# Patient Record
Sex: Female | Born: 1955 | Race: White | Hispanic: No | Smoking: Never smoker
Health system: Southern US, Community
[De-identification: ages and names within clinical notes are randomized; demographics above are authoritative.]

## PROBLEM LIST (undated history)

## (undated) DIAGNOSIS — J302 Other seasonal allergic rhinitis: Secondary | ICD-10-CM

## (undated) DIAGNOSIS — T8859XA Other complications of anesthesia, initial encounter: Secondary | ICD-10-CM

## (undated) DIAGNOSIS — J45909 Unspecified asthma, uncomplicated: Secondary | ICD-10-CM

## (undated) DIAGNOSIS — E739 Lactose intolerance, unspecified: Secondary | ICD-10-CM

## (undated) DIAGNOSIS — I2510 Atherosclerotic heart disease of native coronary artery without angina pectoris: Secondary | ICD-10-CM

## (undated) DIAGNOSIS — R42 Dizziness and giddiness: Secondary | ICD-10-CM

## (undated) DIAGNOSIS — T4145XA Adverse effect of unspecified anesthetic, initial encounter: Secondary | ICD-10-CM

## (undated) DIAGNOSIS — M1990 Unspecified osteoarthritis, unspecified site: Secondary | ICD-10-CM

## (undated) DIAGNOSIS — M2550 Pain in unspecified joint: Secondary | ICD-10-CM

## (undated) DIAGNOSIS — M549 Dorsalgia, unspecified: Secondary | ICD-10-CM

## (undated) DIAGNOSIS — R112 Nausea with vomiting, unspecified: Secondary | ICD-10-CM

## (undated) DIAGNOSIS — Z9889 Other specified postprocedural states: Secondary | ICD-10-CM

## (undated) DIAGNOSIS — Z9109 Other allergy status, other than to drugs and biological substances: Secondary | ICD-10-CM

## (undated) DIAGNOSIS — E559 Vitamin D deficiency, unspecified: Secondary | ICD-10-CM

## (undated) HISTORY — DX: Dorsalgia, unspecified: M54.9

## (undated) HISTORY — DX: Atherosclerotic heart disease of native coronary artery without angina pectoris: I25.10

## (undated) HISTORY — DX: Unspecified osteoarthritis, unspecified site: M19.90

## (undated) HISTORY — DX: Vitamin D deficiency, unspecified: E55.9

## (undated) HISTORY — PX: ABDOMINAL HYSTERECTOMY: SHX81

## (undated) HISTORY — PX: CERVICAL CERCLAGE: SHX1329

## (undated) HISTORY — DX: Pain in unspecified joint: M25.50

## (undated) HISTORY — PX: COLONOSCOPY: SHX174

## (undated) HISTORY — DX: Lactose intolerance, unspecified: E73.9

## (undated) HISTORY — DX: Other seasonal allergic rhinitis: J30.2

## (undated) HISTORY — PX: KNEE ARTHROSCOPY: SUR90

---

## 1998-03-09 ENCOUNTER — Other Ambulatory Visit: Admission: RE | Admit: 1998-03-09 | Discharge: 1998-03-09 | Payer: Self-pay | Admitting: Obstetrics and Gynecology

## 1999-04-18 ENCOUNTER — Other Ambulatory Visit: Admission: RE | Admit: 1999-04-18 | Discharge: 1999-04-18 | Payer: Self-pay | Admitting: Obstetrics and Gynecology

## 2000-05-27 ENCOUNTER — Other Ambulatory Visit: Admission: RE | Admit: 2000-05-27 | Discharge: 2000-05-27 | Payer: Self-pay | Admitting: Obstetrics and Gynecology

## 2000-07-13 ENCOUNTER — Other Ambulatory Visit: Admission: RE | Admit: 2000-07-13 | Discharge: 2000-07-13 | Payer: Self-pay | Admitting: Obstetrics and Gynecology

## 2000-12-21 ENCOUNTER — Other Ambulatory Visit: Admission: RE | Admit: 2000-12-21 | Discharge: 2000-12-21 | Payer: Self-pay | Admitting: Obstetrics and Gynecology

## 2001-07-20 ENCOUNTER — Other Ambulatory Visit: Admission: RE | Admit: 2001-07-20 | Discharge: 2001-07-20 | Payer: Self-pay | Admitting: Obstetrics and Gynecology

## 2001-10-19 ENCOUNTER — Observation Stay (HOSPITAL_COMMUNITY): Admission: RE | Admit: 2001-10-19 | Discharge: 2001-10-20 | Payer: Self-pay | Admitting: Obstetrics and Gynecology

## 2002-08-01 ENCOUNTER — Other Ambulatory Visit: Admission: RE | Admit: 2002-08-01 | Discharge: 2002-08-01 | Payer: Self-pay | Admitting: Obstetrics and Gynecology

## 2003-01-17 ENCOUNTER — Other Ambulatory Visit: Admission: RE | Admit: 2003-01-17 | Discharge: 2003-01-17 | Payer: Self-pay | Admitting: Obstetrics and Gynecology

## 2003-07-20 ENCOUNTER — Other Ambulatory Visit: Admission: RE | Admit: 2003-07-20 | Discharge: 2003-07-20 | Payer: Self-pay | Admitting: Obstetrics and Gynecology

## 2003-08-18 ENCOUNTER — Encounter: Admission: RE | Admit: 2003-08-18 | Discharge: 2003-08-18 | Payer: Self-pay | Admitting: Obstetrics and Gynecology

## 2004-08-06 ENCOUNTER — Ambulatory Visit (HOSPITAL_BASED_OUTPATIENT_CLINIC_OR_DEPARTMENT_OTHER): Admission: RE | Admit: 2004-08-06 | Discharge: 2004-08-06 | Payer: Self-pay | Admitting: Orthopedic Surgery

## 2004-09-16 ENCOUNTER — Encounter: Admission: RE | Admit: 2004-09-16 | Discharge: 2004-09-16 | Payer: Self-pay | Admitting: Obstetrics and Gynecology

## 2004-10-02 ENCOUNTER — Other Ambulatory Visit: Admission: RE | Admit: 2004-10-02 | Discharge: 2004-10-02 | Payer: Self-pay | Admitting: Obstetrics and Gynecology

## 2005-09-18 ENCOUNTER — Encounter: Admission: RE | Admit: 2005-09-18 | Discharge: 2005-09-18 | Payer: Self-pay | Admitting: Obstetrics and Gynecology

## 2005-10-29 ENCOUNTER — Other Ambulatory Visit: Admission: RE | Admit: 2005-10-29 | Discharge: 2005-10-29 | Payer: Self-pay | Admitting: Obstetrics and Gynecology

## 2011-06-17 ENCOUNTER — Other Ambulatory Visit: Payer: Self-pay | Admitting: Obstetrics and Gynecology

## 2011-06-17 DIAGNOSIS — R928 Other abnormal and inconclusive findings on diagnostic imaging of breast: Secondary | ICD-10-CM

## 2011-07-01 ENCOUNTER — Ambulatory Visit
Admission: RE | Admit: 2011-07-01 | Discharge: 2011-07-01 | Disposition: A | Discharge status: Home / Self Care | Payer: BC Managed Care – PPO | Source: Ambulatory Visit | Attending: Obstetrics and Gynecology | Admitting: Obstetrics and Gynecology

## 2011-07-01 ENCOUNTER — Other Ambulatory Visit: Payer: Self-pay | Admitting: Obstetrics and Gynecology

## 2011-07-01 DIAGNOSIS — R928 Other abnormal and inconclusive findings on diagnostic imaging of breast: Secondary | ICD-10-CM

## 2011-11-25 ENCOUNTER — Other Ambulatory Visit: Payer: Self-pay | Admitting: Obstetrics and Gynecology

## 2011-11-25 DIAGNOSIS — N6019 Diffuse cystic mastopathy of unspecified breast: Secondary | ICD-10-CM

## 2011-12-18 ENCOUNTER — Ambulatory Visit
Admission: RE | Admit: 2011-12-18 | Discharge: 2011-12-18 | Disposition: A | Discharge status: Home / Self Care | Payer: BC Managed Care – PPO | Source: Ambulatory Visit | Attending: Obstetrics and Gynecology | Admitting: Obstetrics and Gynecology

## 2011-12-18 DIAGNOSIS — N6019 Diffuse cystic mastopathy of unspecified breast: Secondary | ICD-10-CM

## 2012-05-10 ENCOUNTER — Other Ambulatory Visit: Payer: Self-pay | Admitting: Obstetrics and Gynecology

## 2012-05-10 DIAGNOSIS — R921 Mammographic calcification found on diagnostic imaging of breast: Secondary | ICD-10-CM

## 2012-06-15 ENCOUNTER — Ambulatory Visit
Admission: RE | Admit: 2012-06-15 | Discharge: 2012-06-15 | Disposition: A | Discharge status: Home / Self Care | Payer: BC Managed Care – PPO | Source: Ambulatory Visit | Attending: Obstetrics and Gynecology | Admitting: Obstetrics and Gynecology

## 2012-06-15 DIAGNOSIS — R921 Mammographic calcification found on diagnostic imaging of breast: Secondary | ICD-10-CM

## 2013-01-27 ENCOUNTER — Other Ambulatory Visit: Payer: Self-pay | Admitting: Obstetrics and Gynecology

## 2014-07-28 ENCOUNTER — Other Ambulatory Visit: Payer: Self-pay | Admitting: Obstetrics and Gynecology

## 2014-07-28 DIAGNOSIS — R928 Other abnormal and inconclusive findings on diagnostic imaging of breast: Secondary | ICD-10-CM

## 2014-08-08 ENCOUNTER — Ambulatory Visit
Admission: RE | Admit: 2014-08-08 | Discharge: 2014-08-08 | Disposition: A | Discharge status: Home / Self Care | Payer: BLUE CROSS/BLUE SHIELD | Source: Ambulatory Visit | Attending: Obstetrics and Gynecology | Admitting: Obstetrics and Gynecology

## 2014-08-08 DIAGNOSIS — R928 Other abnormal and inconclusive findings on diagnostic imaging of breast: Secondary | ICD-10-CM

## 2015-01-12 ENCOUNTER — Ambulatory Visit: Payer: Self-pay | Admitting: Surgical

## 2015-01-12 NOTE — Progress Notes (Signed)
A NEW Preoperative surgical consent order has been place into the Epic hospital system for Rachel GitelmanSonja W Matheny on 01/12/2015, 12:52 PM  by Patrica DuelPERKINS, ALEXZANDREW for surgery on 01/29/2015.  She has been scheduled for a Right TKA but also requests a left knee cortisone injection so new consent order placed. Avel Peacerew Perkins, PA-C

## 2015-01-12 NOTE — Progress Notes (Signed)
Preoperative surgical orders have been place into the Epic hospital system for Rachel Velez on 01/12/2015, 11:01 AM  by Patrica DuelPERKINS, Israella Hubert for surgery on 01-29-2015.  Preop Total Knee orders including Experal, IV Tylenol, and IV Decadron as long as there are no contraindications to the above medications. Avel Peacerew Aceyn Kathol, PA-C

## 2015-01-19 NOTE — Patient Instructions (Addendum)
YOUR PROCEDURE IS SCHEDULED ON :  01/29/15  REPORT TO [facility] MAIN ENTRANCE FOLLOW SIGNS TO EAST ELEVATOR - GO TO 3rd FLOOR CHECK IN AT 3 EAST NURSES STATION (SHORT STAY) AT:  9:45 AM  CALL THIS NUMBER IF YOU HAVE PROBLEMS THE MORNING OF SURGERY [phone]  REMEMBER:ONLY 1 PER PERSON MAY GO TO SHORT STAY WITH YOU TO GET READY THE MORNING OF YOUR SURGERY  DO NOT EAT FOOD  AFTER MIDNIGHT  MAY HAVE CLEAR LIQUIDS UNTIL 6:45 AM  TAKE THESE MEDICINES THE MORNING OF SURGERY: NONE     CLEAR LIQUID DIET   Foods Allowed                                                                     Foods Excluded  Coffee and tea, regular and decaf                             liquids that you cannot  Plain Jell-O in any flavor                                             see through such as: Fruit ices (not with fruit pulp)                                     milk, soups, orange juice  Iced Popsicles                                                 All solid food Carbonated beverages, regular and diet                                    Cranberry, grape and apple juices Sports drinks like Gatorade Lightly seasoned clear broth or consume(fat free) Sugar, honey syrup ____________________________________________________________________  YOU MAY NOT HAVE ANY METAL ON YOUR BODY INCLUDING HAIR PINS AND PIERCING'S. DO NOT WEAR JEWELRY, MAKEUP, LOTIONS, POWDERS OR PERFUMES. DO NOT WEAR NAIL POLISH. DO NOT SHAVE 48 HRS PRIOR TO SURGERY. MEN MAY SHAVE FACE AND NECK.  DO NOT BRING VALUABLES TO HOSPITAL. [facility] IS NOT RESPONSIBLE FOR VALUABLES.  CONTACTS, DENTURES OR PARTIALS MAY NOT BE WORN TO SURGERY. LEAVE SUITCASE IN CAR. CAN BE BROUGHT TO ROOM AFTER SURGERY.  PATIENTS DISCHARGED THE DAY OF SURGERY WILL NOT BE ALLOWED TO DRIVE HOME.  PLEASE READ OVER THE FOLLOWING INSTRUCTION SHEETS _________________________________________________________________________________                                  [facility] - PREPARING FOR SURGERY  Before surgery, you can play an important role.  Because skin is not sterile, your skin needs to be as free of germs as  possible.  You can reduce the number of germs on your skin by washing with CHG (chlorahexidine gluconate) soap before surgery.  CHG is an antiseptic cleaner which kills germs and bonds with the skin to continue killing germs even after washing. Please DO NOT use if you have an allergy to CHG or antibacterial soaps.  If your skin becomes reddened/irritated stop using the CHG and inform your nurse when you arrive at Short Stay. Do not shave (including legs and underarms) for at least 48 hours prior to the first CHG shower.  You may shave your face. Please follow these instructions carefully:   1.  Shower with CHG Soap the night before surgery and the  morning of Surgery.   2.  If you choose to wash your hair, wash your hair first as usual with your  normal  Shampoo.   3.  After you shampoo, rinse your hair and body thoroughly to remove the  shampoo.                                         4.  Use CHG as you would any other liquid soap.  You can apply chg directly  to the skin and wash . Gently wash with scrungie or clean wascloth    5.  Apply the CHG Soap to your body ONLY FROM THE NECK DOWN.   Do not use on open                           Wound or open sores. Avoid contact with eyes, ears mouth and genitals (private parts).                        Genitals (private parts) with your normal soap.              6.  Wash thoroughly, paying special attention to the area where your surgery  will be performed.   7.  Thoroughly rinse your body with warm water from the neck down.   8.  DO NOT shower/wash with your normal soap after using and rinsing off  the CHG Soap .                9.  Pat yourself dry with a clean towel.             10.  Wear clean night clothes to bed after shower             11.  Place clean  sheets on your bed the night of your first shower and do not  sleep with pets.  Day of Surgery : Do not apply any lotions/deodorants the morning of surgery.  Please wear clean clothes to the hospital/surgery center.  FAILURE TO FOLLOW THESE INSTRUCTIONS MAY RESULT IN THE CANCELLATION OF YOUR SURGERY    PATIENT SIGNATURE_________________________________  ______________________________________________________________________     Rachel Velez  An incentive spirometer is a tool that can help keep your lungs clear and active. This tool measures how well you are filling your lungs with each breath. Taking long deep breaths may help reverse or decrease the chance of developing breathing (pulmonary) problems (especially infection) following:  A long period of time when you are unable to move or be active. BEFORE THE PROCEDURE   If the spirometer includes an indicator to  show your best effort, your nurse or respiratory therapist will set it to a desired goal.  If possible, sit up straight or lean slightly forward. Try not to slouch.  Hold the incentive spirometer in an upright position. INSTRUCTIONS FOR USE   Sit on the edge of your bed if possible, or sit up as far as you can in bed or on a chair.  Hold the incentive spirometer in an upright position.  Breathe out normally.  Place the mouthpiece in your mouth and seal your lips tightly around it.  Breathe in slowly and as deeply as possible, raising the piston or the ball toward the top of the column.  Hold your breath for 3-5 seconds or for as long as possible. Allow the piston or ball to fall to the bottom of the column.  Remove the mouthpiece from your mouth and breathe out normally.  Rest for a few seconds and repeat Steps 1 through 7 at least 10 times every 1-2 hours when you are awake. Take your time and take a few normal breaths between deep breaths.  The spirometer may include an indicator to show your best  effort. Use the indicator as a goal to work toward during each repetition.  After each set of 10 deep breaths, practice coughing to be sure your lungs are clear. If you have an incision (the cut made at the time of surgery), support your incision when coughing by placing a pillow or rolled up towels firmly against it. Once you are able to get out of bed, walk around indoors and cough well. You may stop using the incentive spirometer when instructed by your caregiver.  RISKS AND COMPLICATIONS  Take your time so you do not get dizzy or light-headed.  If you are in pain, you may need to take or ask for pain medication before doing incentive spirometry. It is harder to take a deep breath if you are having pain. AFTER USE  Rest and breathe slowly and easily.  It can be helpful to keep track of a log of your progress. Your caregiver can provide you with a simple table to help with this. If you are using the spirometer at home, follow these instructions: SEEK MEDICAL CARE IF:   You are having difficultly using the spirometer.  You have trouble using the spirometer as often as instructed.  Your pain medication is not giving enough relief while using the spirometer.  You develop fever of 100.5 F (38.1 C) or higher. SEEK IMMEDIATE MEDICAL CARE IF:   You cough up bloody sputum that had not been present before.  You develop fever of 102 F (38.9 C) or greater.  You develop worsening pain at or near the incision site. MAKE SURE YOU:   Understand these instructions.  Will watch your condition.  Will get help right away if you are not doing well or get worse. Document Released: 11/10/2006 Document Revised: 09/22/2011 Document Reviewed: 01/11/2007 Exit[employer] Patient Information 2014 [employer], [place].   ________________________________________________________________________  WHAT IS A BLOOD TRANSFUSION? Blood Transfusion Information  A transfusion is the replacement of blood or some of  its parts. Blood is made up of multiple cells which provide different functions.  Red blood cells carry oxygen and are used for blood loss replacement.  White blood cells fight against infection.  Platelets control bleeding.  Plasma helps clot blood.  Other blood products are available for specialized needs, such as hemophilia or other clotting disorders. BEFORE THE TRANSFUSION  Who gives  blood for transfusions?   Healthy volunteers who are fully evaluated to make sure their blood is safe. This is blood bank blood. Transfusion therapy is the safest it has ever been in the practice of medicine. Before blood is taken from a donor, a complete history is taken to make sure that person has no history of diseases nor engages in risky social behavior (examples are intravenous drug use or sexual activity with multiple partners). The donor's travel history is screened to minimize risk of transmitting infections, such as malaria. The donated blood is tested for signs of infectious diseases, such as HIV and hepatitis. The blood is then tested to be sure it is compatible with you in order to minimize the chance of a transfusion reaction. If you or a relative donates blood, this is often done in anticipation of surgery and is not appropriate for emergency situations. It takes many days to process the donated blood. RISKS AND COMPLICATIONS Although transfusion therapy is very safe and saves many lives, the main dangers of transfusion include:   Getting an infectious disease.  Developing a transfusion reaction. This is an allergic reaction to something in the blood you were given. Every precaution is taken to prevent this. The decision to have a blood transfusion has been considered carefully by your caregiver before blood is given. Blood is not given unless the benefits outweigh the risks. AFTER THE TRANSFUSION  Right after receiving a blood transfusion, you will usually feel much better and more  energetic. This is especially true if your red blood cells have gotten low (anemic). The transfusion raises the level of the red blood cells which carry oxygen, and this usually causes an energy increase.  The nurse administering the transfusion will monitor you carefully for complications. HOME CARE INSTRUCTIONS  No special instructions are needed after a transfusion. You may find your energy is better. Speak with your caregiver about any limitations on activity for underlying diseases you may have. SEEK MEDICAL CARE IF:   Your condition is not improving after your transfusion.  You develop redness or irritation at the intravenous (IV) site. SEEK IMMEDIATE MEDICAL CARE IF:  Any of the following symptoms occur over the next 12 hours:  Shaking chills.  You have a temperature by mouth above 102 F (38.9 C), not controlled by medicine.  Chest, back, or muscle pain.  People around you feel you are not acting correctly or are confused.  Shortness of breath or difficulty breathing.  Dizziness and fainting.  You get a rash or develop hives.  You have a decrease in urine output.  Your urine turns a dark color or changes to pink, red, or brown. Any of the following symptoms occur over the next 10 days:  You have a temperature by mouth above 102 F (38.9 C), not controlled by medicine.  Shortness of breath.  Weakness after normal activity.  The white part of the eye turns yellow (jaundice).  You have a decrease in the amount of urine or are urinating less often.  Your urine turns a dark color or changes to pink, red, or brown. Document Released: 06/27/2000 Document Revised: 09/22/2011 Document Reviewed: 02/14/2008 [facility] Patient Information 2014 [place], [place].  _______________________________________________________________________

## 2015-01-22 ENCOUNTER — Encounter (HOSPITAL_COMMUNITY): Payer: Self-pay

## 2015-01-22 ENCOUNTER — Encounter (HOSPITAL_COMMUNITY)
Admission: RE | Admit: 2015-01-22 | Discharge: 2015-01-22 | Disposition: A | Discharge status: Home / Self Care | Payer: BLUE CROSS/BLUE SHIELD | Source: Ambulatory Visit | Attending: Orthopedic Surgery | Admitting: Orthopedic Surgery

## 2015-01-22 DIAGNOSIS — M179 Osteoarthritis of knee, unspecified: Secondary | ICD-10-CM | POA: Diagnosis not present

## 2015-01-22 DIAGNOSIS — Z01818 Encounter for other preprocedural examination: Secondary | ICD-10-CM | POA: Diagnosis present

## 2015-01-22 HISTORY — DX: Unspecified osteoarthritis, unspecified site: M19.90

## 2015-01-22 HISTORY — DX: Other allergy status, other than to drugs and biological substances: Z91.09

## 2015-01-22 HISTORY — DX: Adverse effect of unspecified anesthetic, initial encounter: T41.45XA

## 2015-01-22 HISTORY — DX: Nausea with vomiting, unspecified: Z98.890

## 2015-01-22 HISTORY — DX: Dizziness and giddiness: R42

## 2015-01-22 HISTORY — DX: Other complications of anesthesia, initial encounter: T88.59XA

## 2015-01-22 HISTORY — DX: Nausea with vomiting, unspecified: R11.2

## 2015-01-22 LAB — ABO/RH: ABO/RH(D): O POS

## 2015-01-22 LAB — COMPREHENSIVE METABOLIC PANEL
ALT: 14 U/L (ref 14–54)
ANION GAP: 11 (ref 5–15)
AST: 20 U/L (ref 15–41)
Albumin: 4.3 g/dL (ref 3.5–5.0)
Alkaline Phosphatase: 43 U/L (ref 38–126)
BILIRUBIN TOTAL: 0.7 mg/dL (ref 0.3–1.2)
BUN: 14 mg/dL (ref 6–20)
CO2: 27 mmol/L (ref 22–32)
CREATININE: 0.78 mg/dL (ref 0.44–1.00)
Calcium: 9.2 mg/dL (ref 8.9–10.3)
Chloride: 104 mmol/L (ref 101–111)
GFR calc non Af Amer: >60 mL/min (ref >60)
Glucose, Bld: 88 mg/dL (ref 65–99)
POTASSIUM: 3.7 mmol/L (ref 3.5–5.1)
Sodium: 142 mmol/L (ref 135–145)
Total Protein: 7 g/dL (ref 6.5–8.1)

## 2015-01-22 LAB — URINALYSIS, ROUTINE W REFLEX MICROSCOPIC
Bilirubin Urine: NEGATIVE
GLUCOSE, UA: NEGATIVE mg/dL
Hgb urine dipstick: NEGATIVE
Ketones, ur: NEGATIVE mg/dL
Leukocytes, UA: NEGATIVE
Nitrite: NEGATIVE
PH: 6 (ref 5.0–8.0)
Protein, ur: NEGATIVE mg/dL
Specific Gravity, Urine: 1.006 (ref 1.005–1.030)
Urobilinogen, UA: 0.2 mg/dL (ref 0.0–1.0)

## 2015-01-22 LAB — CBC
HCT: 39.3 % (ref 36.0–46.0)
Hemoglobin: 12.6 g/dL (ref 12.0–15.0)
MCH: 30.1 pg (ref 26.0–34.0)
MCHC: 32.1 g/dL (ref 30.0–36.0)
MCV: 94 fL (ref 78.0–100.0)
Platelets: 203 10*3/uL (ref 150–400)
RBC: 4.18 MIL/uL (ref 3.87–5.11)
RDW: 12.7 % (ref 11.5–15.5)
WBC: 5.2 10*3/uL (ref 4.0–10.5)

## 2015-01-22 LAB — SURGICAL PCR SCREEN
MRSA, PCR: NEGATIVE
Staphylococcus aureus: NEGATIVE

## 2015-01-22 LAB — PROTIME-INR
INR: 0.94 (ref 0.00–1.49)
Prothrombin Time: 12.8 seconds (ref 11.6–15.2)

## 2015-01-22 LAB — APTT: aPTT: 27 seconds (ref 24–37)

## 2015-01-24 NOTE — H&P (Signed)
TOTAL KNEE ADMISSION H&P  Patient is being admitted for right total knee arthroplasty.  Subjective:  Chief Complaint:right knee pain.  HPI: Rachel Velez, 59 y.o. female, has a history of pain and functional disability in the right knee due to arthritis and has failed non-surgical conservative treatments for greater than 12 weeks to includeNSAID's and/or analgesics, corticosteriod injections, viscosupplementation injections and activity modification.  Onset of symptoms was gradual, starting >10 years ago with gradually worsening course since that time. The patient noted prior procedures on the knee to include  arthroscopy and menisectomy on the right knee(s).  Patient currently rates pain in the right knee(s) at 7 out of 10 with activity. Patient has night pain, worsening of pain with activity and weight bearing, pain that interferes with activities of daily living, pain with passive range of motion, crepitus and joint swelling.  Patient has evidence of periarticular osteophytes and joint space narrowing by imaging studies. There is no active infection.  Past Medical History  Diagnosis Date  . Complication of anesthesia   . PONV (postoperative nausea and vomiting)   . Environmental allergies   . Arthritis   . Vertigo     Past Surgical History  Procedure Laterality Date  . Cervical cerclage      x2  . Cesarean section      x2  . Abdominal hysterectomy    . Colonoscopy    . Knee arthroscopy      rt      Current outpatient prescriptions:  .  CALCIUM-MAGNESIUM-ZINC PO, Take 1 tablet by mouth daily., Disp: , Rfl:  .  cholecalciferol (VITAMIN D) 1000 UNITS tablet, Take 1,000 Units by mouth every evening., Disp: , Rfl:  .  glucosamine-chondroitin 500-400 MG tablet, Take 2 tablets by mouth daily., Disp: , Rfl:  .  MINIVELLE 0.05 MG/24HR patch, APPLY 1 PATCH TWICE WEEKLY AS DIRECTED., Disp: , Rfl: 3 .  naproxen sodium (ANAPROX) 220 MG tablet, Take 220 mg by mouth 2 (two) times daily with  a meal., Disp: , Rfl:  .  Omega 3 1000 MG CAPS, Take 1 capsule by mouth daily., Disp: , Rfl:  .  vitamin C (ASCORBIC ACID) 500 MG tablet, Take 500 mg by mouth daily., Disp: , Rfl:   Allergies  Allergen Reactions  . Biaxin [Clarithromycin]     Made her spacey  . Penicillins Hives  . Sulfa Antibiotics Hives    History  Substance Use Topics  . Smoking status: Never Smoker   . Smokeless tobacco: No  . Alcohol Use: Yes     Comment: occasional      Review of Systems  Constitutional: Positive for malaise/fatigue and diaphoresis. Negative for fever, chills and weight loss.  HENT: Positive for hearing loss. Negative for congestion, ear discharge, ear pain, nosebleeds, sore throat and tinnitus.   Eyes: Negative.   Respiratory: Negative.  Negative for stridor.   Cardiovascular: Negative.   Gastrointestinal: Negative.   Genitourinary: Negative.   Musculoskeletal: Positive for myalgias and joint pain. Negative for back pain, falls and neck pain.       Right knee pain  Skin: Negative.   Neurological: Negative.  Negative for weakness and headaches.  Endo/Heme/Allergies: Negative.   Psychiatric/Behavioral: Positive for memory loss. Negative for depression, suicidal ideas, hallucinations and substance abuse. The patient is not nervous/anxious and does not have insomnia.     Objective:  Physical Exam  Constitutional: She is oriented to person, place, and time. She appears well-developed and well-nourished. No distress.  HENT:  Head: Normocephalic and atraumatic.  Right Ear: External ear normal.  Left Ear: External ear normal.  Nose: Nose normal.  Mouth/Throat: Oropharynx is clear and moist.  Eyes: Conjunctivae and EOM are normal.  Neck: Normal range of motion. Neck supple.  Cardiovascular: Normal rate, normal heart sounds and intact distal pulses.   No murmur heard. Respiratory: Effort normal and breath sounds normal. No respiratory distress. She has no wheezes.  GI: Soft. Bowel  sounds are normal. She exhibits no distension. There is no tenderness.  Musculoskeletal:       Right hip: Normal.       Left hip: Normal.       Right knee: She exhibits decreased range of motion and swelling. She exhibits no effusion and no erythema. Tenderness found. Medial joint line and lateral joint line tenderness noted.       Left knee: Normal.  Her knee on the right shows 3-degrees from full extension, further flexion to 105-degrees; crepitation throughout the range of motion. Her left knee shows full extension, further flexion to 125-degrees with crepitation at the patellofemoral joint.  Neurological: She is alert and oriented to person, place, and time. She has normal strength and normal reflexes. No sensory deficit.  Skin: No rash noted. She is not diaphoretic. No erythema.  Psychiatric: She has a normal mood and affect. Her behavior is normal.    Vitals  Weight: 150 lb Height: 63in Body Surface Area: 1.71 m Body Mass Index: 26.57 kg/m  Pulse: 72 (Regular)  BP: 118/76 (Sitting, Left Arm, Standard)  Imaging Review Plain radiographs demonstrate severe degenerative joint disease of the right knee(s). The overall alignment ismild varus. The bone quality appears to be good for age and reported activity level.  Assessment/Plan:  End stage primary osteoarthritis, right knee   The patient history, physical examination, clinical judgment of the provider and imaging studies are consistent with end stage degenerative joint disease of the right knee(s) and total knee arthroplasty is deemed medically necessary. The treatment options including medical management, injection therapy arthroscopy and arthroplasty were discussed at length. The risks and benefits of total knee arthroplasty were presented and reviewed. The risks due to aseptic loosening, infection, stiffness, patella tracking problems, thromboembolic complications and other imponderables were discussed. The patient  acknowledged the explanation, agreed to proceed with the plan and consent was signed. Patient is being admitted for inpatient treatment for surgery, pain control, PT, OT, prophylactic antibiotics, VTE prophylaxis, progressive ambulation and ADL's and discharge planning. The patient is planning to be discharged home with home health services     TXA IV PCP: Richardean ChimeraJohn McComb Needs walker History of extreme nausea and vomiting with anesthesia    Dimitri PedAmber Davine Coba, PA-C

## 2015-01-25 ENCOUNTER — Ambulatory Visit: Payer: Self-pay | Admitting: Orthopedic Surgery

## 2015-01-25 NOTE — Progress Notes (Signed)
New surgical consent order have been place into the Epic hospital system for Rachel GitelmanSonja W Prevost on 01/25/2015 to have a cortisone injection placed into the left knee during the right knee surgery, 9:58 AM  by Patrica DuelPERKINS, ALEXZANDREW for surgery on 01/29/2015. Avel Peacerew Perkins, PA-C

## 2015-01-29 ENCOUNTER — Encounter (HOSPITAL_COMMUNITY)
Admission: RE | Disposition: A | Discharge status: Home Health | Payer: Self-pay | Source: Ambulatory Visit | Attending: Orthopedic Surgery

## 2015-01-29 ENCOUNTER — Encounter (HOSPITAL_COMMUNITY): Payer: Self-pay | Admitting: *Deleted

## 2015-01-29 ENCOUNTER — Inpatient Hospital Stay (HOSPITAL_COMMUNITY)
Admission: RE | Admit: 2015-01-29 | Discharge: 2015-01-31 | DRG: 470 | Disposition: A | Discharge status: Home Health | Payer: BLUE CROSS/BLUE SHIELD | Source: Ambulatory Visit | Attending: Orthopedic Surgery | Admitting: Orthopedic Surgery

## 2015-01-29 ENCOUNTER — Inpatient Hospital Stay (HOSPITAL_COMMUNITY): Payer: BLUE CROSS/BLUE SHIELD | Admitting: Registered Nurse

## 2015-01-29 DIAGNOSIS — M170 Bilateral primary osteoarthritis of knee: Principal | ICD-10-CM | POA: Diagnosis present

## 2015-01-29 DIAGNOSIS — M25569 Pain in unspecified knee: Secondary | ICD-10-CM | POA: Diagnosis present

## 2015-01-29 DIAGNOSIS — M179 Osteoarthritis of knee, unspecified: Secondary | ICD-10-CM | POA: Diagnosis present

## 2015-01-29 DIAGNOSIS — Z01812 Encounter for preprocedural laboratory examination: Secondary | ICD-10-CM | POA: Diagnosis not present

## 2015-01-29 DIAGNOSIS — M1710 Unilateral primary osteoarthritis, unspecified knee: Secondary | ICD-10-CM | POA: Diagnosis present

## 2015-01-29 DIAGNOSIS — M1711 Unilateral primary osteoarthritis, right knee: Secondary | ICD-10-CM

## 2015-01-29 HISTORY — PX: TOTAL KNEE ARTHROPLASTY: SHX125

## 2015-01-29 LAB — TYPE AND SCREEN
ABO/RH(D): O POS
Antibody Screen: NEGATIVE

## 2015-01-29 SURGERY — ARTHROPLASTY, KNEE, TOTAL
Anesthesia: Choice | Site: Knee | Laterality: Right

## 2015-01-29 MED ORDER — ACETAMINOPHEN 650 MG RE SUPP: 650.0000 mg | Freq: Four times a day (QID) | RECTAL | Status: DC | PRN

## 2015-01-29 MED ORDER — POLYETHYLENE GLYCOL 3350 17 G PO PACK: 17.0000 g | PACK | Freq: Every day | ORAL | Status: DC | PRN

## 2015-01-29 MED ORDER — BUPIVACAINE LIPOSOME 1.3 % IJ SUSP
20.0000 mL | Freq: Once | INTRAMUSCULAR | Status: DC
  Filled 2015-01-29: qty 20

## 2015-01-29 MED ORDER — ONDANSETRON HCL 4 MG/2ML IJ SOLN
4.0000 mg | Freq: Four times a day (QID) | INTRAMUSCULAR | Status: DC | PRN
  Administered 2015-01-31: 4 mg via INTRAVENOUS
  Filled 2015-01-29: qty 2

## 2015-01-29 MED ORDER — OXYCODONE HCL 5 MG PO TABS
5.0000 mg | ORAL_TABLET | ORAL | Status: DC | PRN
  Administered 2015-01-29 – 2015-01-31 (×11): 10 mg via ORAL
  Filled 2015-01-29 (×11): qty 2

## 2015-01-29 MED ORDER — PHENYLEPHRINE HCL 10 MG/ML IJ SOLN
INTRAMUSCULAR | Status: DC | PRN
  Administered 2015-01-29 (×8): 80 ug via INTRAVENOUS

## 2015-01-29 MED ORDER — DOCUSATE SODIUM 100 MG PO CAPS
100.0000 mg | ORAL_CAPSULE | Freq: Two times a day (BID) | ORAL | Status: DC
  Administered 2015-01-29 – 2015-01-31 (×4): 100 mg via ORAL

## 2015-01-29 MED ORDER — METHOCARBAMOL 500 MG PO TABS
500.0000 mg | ORAL_TABLET | Freq: Four times a day (QID) | ORAL | Status: DC | PRN
  Administered 2015-01-30 – 2015-01-31 (×4): 500 mg via ORAL
  Filled 2015-01-29 (×5): qty 1

## 2015-01-29 MED ORDER — VANCOMYCIN HCL IN DEXTROSE 1-5 GM/200ML-% IV SOLN
1000.0000 mg | Freq: Two times a day (BID) | INTRAVENOUS | Status: AC
Start: 2015-01-30 — End: 2015-01-30
  Administered 2015-01-29: 1000 mg via INTRAVENOUS
  Filled 2015-01-29: qty 200

## 2015-01-29 MED ORDER — KCL IN DEXTROSE-NACL 20-5-0.45 MEQ/L-%-% IV SOLN
INTRAVENOUS | Status: DC
  Administered 2015-01-29: 19:00:00 via INTRAVENOUS
  Filled 2015-01-29 (×3): qty 1000

## 2015-01-29 MED ORDER — FENTANYL CITRATE (PF) 100 MCG/2ML IJ SOLN: 25.0000 ug | INTRAMUSCULAR | Status: DC | PRN

## 2015-01-29 MED ORDER — METHOCARBAMOL 1000 MG/10ML IJ SOLN
500.0000 mg | Freq: Four times a day (QID) | INTRAVENOUS | Status: DC | PRN
  Administered 2015-01-29: 500 mg via INTRAVENOUS
  Filled 2015-01-29 (×2): qty 5

## 2015-01-29 MED ORDER — DEXAMETHASONE SODIUM PHOSPHATE 10 MG/ML IJ SOLN
10.0000 mg | Freq: Once | INTRAMUSCULAR | Status: AC
  Administered 2015-01-30: 10 mg via INTRAVENOUS
  Filled 2015-01-29 (×2): qty 1

## 2015-01-29 MED ORDER — DIPHENHYDRAMINE HCL 12.5 MG/5ML PO ELIX: 12.5000 mg | ORAL_SOLUTION | ORAL | Status: DC | PRN

## 2015-01-29 MED ORDER — MORPHINE SULFATE 2 MG/ML IJ SOLN
1.0000 mg | INTRAMUSCULAR | Status: DC | PRN
  Administered 2015-01-29 – 2015-01-30 (×2): 2 mg via INTRAVENOUS
  Filled 2015-01-29 (×2): qty 1

## 2015-01-29 MED ORDER — DEXAMETHASONE SODIUM PHOSPHATE 10 MG/ML IJ SOLN
INTRAMUSCULAR | Status: AC
  Filled 2015-01-29: qty 1

## 2015-01-29 MED ORDER — PROPOFOL 10 MG/ML IV BOLUS
INTRAVENOUS | Status: AC
  Filled 2015-01-29: qty 20

## 2015-01-29 MED ORDER — ACETAMINOPHEN 325 MG PO TABS
650.0000 mg | ORAL_TABLET | Freq: Four times a day (QID) | ORAL | Status: DC | PRN
  Administered 2015-01-31: 650 mg via ORAL
  Filled 2015-01-29: qty 2

## 2015-01-29 MED ORDER — VANCOMYCIN HCL IN DEXTROSE 1-5 GM/200ML-% IV SOLN
1000.0000 mg | INTRAVENOUS | Status: AC
  Administered 2015-01-29: 1000 mg via INTRAVENOUS
  Filled 2015-01-29: qty 200

## 2015-01-29 MED ORDER — LACTATED RINGERS IV SOLN: INTRAVENOUS | Status: DC

## 2015-01-29 MED ORDER — SODIUM CHLORIDE 0.9 % IJ SOLN
INTRAMUSCULAR | Status: DC | PRN
  Administered 2015-01-29: 30 mL

## 2015-01-29 MED ORDER — MENTHOL 3 MG MT LOZG: 1.0000 | LOZENGE | OROMUCOSAL | Status: DC | PRN

## 2015-01-29 MED ORDER — DEXAMETHASONE SODIUM PHOSPHATE 10 MG/ML IJ SOLN
10.0000 mg | Freq: Once | INTRAMUSCULAR | Status: AC
  Administered 2015-01-29: 10 mg via INTRAVENOUS

## 2015-01-29 MED ORDER — METOCLOPRAMIDE HCL 10 MG PO TABS: 5.0000 mg | ORAL_TABLET | Freq: Three times a day (TID) | ORAL | Status: DC | PRN

## 2015-01-29 MED ORDER — BUPIVACAINE LIPOSOME 1.3 % IJ SUSP
INTRAMUSCULAR | Status: DC | PRN
  Administered 2015-01-29: 20 mL

## 2015-01-29 MED ORDER — BUPIVACAINE HCL (PF) 0.25 % IJ SOLN
INTRAMUSCULAR | Status: AC
  Filled 2015-01-29: qty 30

## 2015-01-29 MED ORDER — ACETAMINOPHEN 500 MG PO TABS
1000.0000 mg | ORAL_TABLET | Freq: Four times a day (QID) | ORAL | Status: AC
  Administered 2015-01-29 – 2015-01-30 (×4): 1000 mg via ORAL
  Filled 2015-01-29 (×4): qty 2

## 2015-01-29 MED ORDER — SODIUM CHLORIDE 0.9 % IJ SOLN
INTRAMUSCULAR | Status: AC
  Filled 2015-01-29: qty 50

## 2015-01-29 MED ORDER — FENTANYL CITRATE (PF) 100 MCG/2ML IJ SOLN
INTRAMUSCULAR | Status: DC | PRN
  Administered 2015-01-29: 75 ug via INTRAVENOUS
  Administered 2015-01-29: 25 ug via INTRAVENOUS

## 2015-01-29 MED ORDER — ONDANSETRON HCL 4 MG/2ML IJ SOLN
INTRAMUSCULAR | Status: AC
  Filled 2015-01-29: qty 2

## 2015-01-29 MED ORDER — SCOPOLAMINE 1 MG/3DAYS TD PT72
MEDICATED_PATCH | TRANSDERMAL | Status: AC
  Filled 2015-01-29: qty 1

## 2015-01-29 MED ORDER — DIPHENHYDRAMINE HCL 50 MG/ML IJ SOLN
INTRAMUSCULAR | Status: AC
  Filled 2015-01-29: qty 1

## 2015-01-29 MED ORDER — BUPIVACAINE HCL 0.25 % IJ SOLN
INTRAMUSCULAR | Status: DC | PRN
  Administered 2015-01-29: 20 mL

## 2015-01-29 MED ORDER — BUPIVACAINE IN DEXTROSE 0.75-8.25 % IT SOLN
INTRATHECAL | Status: DC | PRN
  Administered 2015-01-29: 1.8 mL via INTRATHECAL

## 2015-01-29 MED ORDER — METOCLOPRAMIDE HCL 5 MG/ML IJ SOLN
5.0000 mg | Freq: Three times a day (TID) | INTRAMUSCULAR | Status: DC | PRN
  Administered 2015-01-31: 10 mg via INTRAVENOUS
  Filled 2015-01-29: qty 2

## 2015-01-29 MED ORDER — PHENOL 1.4 % MT LIQD: 1.0000 | OROMUCOSAL | Status: DC | PRN

## 2015-01-29 MED ORDER — ONDANSETRON HCL 4 MG PO TABS: 4.0000 mg | ORAL_TABLET | Freq: Four times a day (QID) | ORAL | Status: DC | PRN

## 2015-01-29 MED ORDER — SCOPOLAMINE 1 MG/3DAYS TD PT72
MEDICATED_PATCH | TRANSDERMAL | Status: DC | PRN
  Administered 2015-01-29: 1 via TRANSDERMAL

## 2015-01-29 MED ORDER — DIPHENHYDRAMINE HCL 50 MG/ML IJ SOLN
INTRAMUSCULAR | Status: DC | PRN
  Administered 2015-01-29: 12.5 mg via INTRAVENOUS

## 2015-01-29 MED ORDER — BISACODYL 10 MG RE SUPP: 10.0000 mg | Freq: Every day | RECTAL | Status: DC | PRN

## 2015-01-29 MED ORDER — ACETAMINOPHEN 10 MG/ML IV SOLN
1000.0000 mg | Freq: Once | INTRAVENOUS | Status: AC
  Administered 2015-01-29: 1000 mg via INTRAVENOUS

## 2015-01-29 MED ORDER — LACTATED RINGERS IV SOLN
INTRAVENOUS | Status: DC
Start: 2015-01-29 — End: 2015-01-29
  Administered 2015-01-29 (×4): via INTRAVENOUS

## 2015-01-29 MED ORDER — TRANEXAMIC ACID 1000 MG/10ML IV SOLN
1000.0000 mg | INTRAVENOUS | Status: AC
  Administered 2015-01-29: 1000 mg via INTRAVENOUS
  Filled 2015-01-29: qty 10

## 2015-01-29 MED ORDER — PROMETHAZINE HCL 25 MG/ML IJ SOLN: 6.2500 mg | INTRAMUSCULAR | Status: DC | PRN

## 2015-01-29 MED ORDER — RIVAROXABAN 10 MG PO TABS
10.0000 mg | ORAL_TABLET | Freq: Every day | ORAL | Status: DC
Start: 2015-01-30 — End: 2015-01-31
  Administered 2015-01-30 – 2015-01-31 (×2): 10 mg via ORAL
  Filled 2015-01-29 (×3): qty 1

## 2015-01-29 MED ORDER — CHLORHEXIDINE GLUCONATE 4 % EX LIQD: 60.0000 mL | Freq: Once | CUTANEOUS | Status: DC

## 2015-01-29 MED ORDER — TRAMADOL HCL 50 MG PO TABS: 50.0000 mg | ORAL_TABLET | Freq: Four times a day (QID) | ORAL | Status: DC | PRN

## 2015-01-29 MED ORDER — ONDANSETRON HCL 4 MG/2ML IJ SOLN
INTRAMUSCULAR | Status: DC | PRN
  Administered 2015-01-29: 4 mg via INTRAVENOUS

## 2015-01-29 MED ORDER — ACETAMINOPHEN 10 MG/ML IV SOLN
INTRAVENOUS | Status: AC
  Filled 2015-01-29: qty 100

## 2015-01-29 MED ORDER — ARTIFICIAL TEARS OP OINT
TOPICAL_OINTMENT | OPHTHALMIC | Status: AC
  Filled 2015-01-29: qty 3.5

## 2015-01-29 MED ORDER — METHYLPREDNISOLONE ACETATE 80 MG/ML IJ SUSP
INTRAMUSCULAR | Status: DC | PRN
  Administered 2015-01-29: 80 mg

## 2015-01-29 MED ORDER — MIDAZOLAM HCL 2 MG/2ML IJ SOLN
INTRAMUSCULAR | Status: AC
  Filled 2015-01-29: qty 2

## 2015-01-29 MED ORDER — MIDAZOLAM HCL 5 MG/5ML IJ SOLN
INTRAMUSCULAR | Status: DC | PRN
  Administered 2015-01-29: 2 mg via INTRAVENOUS

## 2015-01-29 MED ORDER — PROPOFOL INFUSION 10 MG/ML OPTIME
INTRAVENOUS | Status: DC | PRN
  Administered 2015-01-29: 100 ug/kg/min via INTRAVENOUS

## 2015-01-29 MED ORDER — FLEET ENEMA 7-19 GM/118ML RE ENEM: 1.0000 | ENEMA | Freq: Once | RECTAL | Status: AC | PRN

## 2015-01-29 MED ORDER — KETOROLAC TROMETHAMINE 15 MG/ML IJ SOLN
7.5000 mg | Freq: Four times a day (QID) | INTRAMUSCULAR | Status: AC | PRN
Start: 2015-01-29 — End: 2015-01-30

## 2015-01-29 MED ORDER — FENTANYL CITRATE (PF) 100 MCG/2ML IJ SOLN
INTRAMUSCULAR | Status: AC
  Filled 2015-01-29: qty 2

## 2015-01-29 MED ORDER — METHYLPREDNISOLONE ACETATE 40 MG/ML IJ SUSP
INTRAMUSCULAR | Status: AC
  Filled 2015-01-29: qty 1

## 2015-01-29 MED ORDER — MEPERIDINE HCL 50 MG/ML IJ SOLN: 6.2500 mg | INTRAMUSCULAR | Status: DC | PRN

## 2015-01-29 MED ORDER — SODIUM CHLORIDE 0.9 % IV SOLN
INTRAVENOUS | Status: DC
Start: 2015-01-29 — End: 2015-01-29

## 2015-01-29 SURGICAL SUPPLY — 60 items
BAG DECANTER FOR FLEXI CONT (MISCELLANEOUS) ×2 IMPLANT
BAG ZIPLOCK 12X15 (MISCELLANEOUS) ×2 IMPLANT
BANDAGE ELASTIC 6 VELCRO ST LF (GAUZE/BANDAGES/DRESSINGS) ×2 IMPLANT
BANDAGE ESMARK 6X9 LF (GAUZE/BANDAGES/DRESSINGS) ×1 IMPLANT
BLADE SAG 18X100X1.27 (BLADE) ×2 IMPLANT
BLADE SAW SGTL 11.0X1.19X90.0M (BLADE) ×2 IMPLANT
BNDG ESMARK 6X9 LF (GAUZE/BANDAGES/DRESSINGS) ×2
BOWL SMART MIX CTS (DISPOSABLE) ×2 IMPLANT
CAPT KNEE TOTAL 3 ATTUNE ×2 IMPLANT
CEMENT HV SMART SET (Cement) ×4 IMPLANT
CUFF TOURN SGL QUICK 34 (TOURNIQUET CUFF) ×1
CUFF TRNQT CYL 34X4X40X1 (TOURNIQUET CUFF) ×1 IMPLANT
DECANTER SPIKE VIAL GLASS SM (MISCELLANEOUS) ×2 IMPLANT
DRAPE EXTREMITY T 121X128X90 (DRAPE) ×2 IMPLANT
DRAPE POUCH INSTRU U-SHP 10X18 (DRAPES) ×2 IMPLANT
DRAPE U-SHAPE 47X51 STRL (DRAPES) ×2 IMPLANT
DRSG ADAPTIC 3X8 NADH LF (GAUZE/BANDAGES/DRESSINGS) ×2 IMPLANT
DRSG PAD ABDOMINAL 8X10 ST (GAUZE/BANDAGES/DRESSINGS) ×2 IMPLANT
DURAPREP 26ML APPLICATOR (WOUND CARE) ×2 IMPLANT
ELECT REM PT RETURN 9FT ADLT (ELECTROSURGICAL) ×2
ELECTRODE REM PT RTRN 9FT ADLT (ELECTROSURGICAL) ×1 IMPLANT
EVACUATOR 1/8 PVC DRAIN (DRAIN) ×2 IMPLANT
FACESHIELD WRAPAROUND (MASK) ×6 IMPLANT
GAUZE SPONGE 4X4 12PLY STRL (GAUZE/BANDAGES/DRESSINGS) ×2 IMPLANT
GLOVE BIO SURGEON STRL SZ7.5 (GLOVE) ×6 IMPLANT
GLOVE BIO SURGEON STRL SZ8 (GLOVE) ×2 IMPLANT
GLOVE BIOGEL PI IND STRL 6.5 (GLOVE) ×1 IMPLANT
GLOVE BIOGEL PI IND STRL 8 (GLOVE) ×1 IMPLANT
GLOVE BIOGEL PI INDICATOR 6.5 (GLOVE) ×1
GLOVE BIOGEL PI INDICATOR 8 (GLOVE) ×1
GLOVE SURG SS PI 6.5 STRL IVOR (GLOVE) ×2 IMPLANT
GOWN STRL REUS W/TWL LRG LVL3 (GOWN DISPOSABLE) ×2 IMPLANT
GOWN STRL REUS W/TWL XL LVL3 (GOWN DISPOSABLE) ×2 IMPLANT
HANDPIECE INTERPULSE COAX TIP (DISPOSABLE) ×1
IMMOBILIZER KNEE 20 (SOFTGOODS) ×2
IMMOBILIZER KNEE 20 THIGH 36 (SOFTGOODS) ×1 IMPLANT
KIT BASIN OR (CUSTOM PROCEDURE TRAY) ×2 IMPLANT
MANIFOLD NEPTUNE II (INSTRUMENTS) ×2 IMPLANT
NDL SAFETY ECLIPSE 18X1.5 (NEEDLE) ×2 IMPLANT
NEEDLE HYPO 18GX1.5 SHARP (NEEDLE) ×2
NS IRRIG 1000ML POUR BTL (IV SOLUTION) ×2 IMPLANT
PACK TOTAL JOINT (CUSTOM PROCEDURE TRAY) ×2 IMPLANT
PADDING CAST COTTON 6X4 STRL (CAST SUPPLIES) ×2 IMPLANT
PEN SKIN MARKING BROAD (MISCELLANEOUS) ×2 IMPLANT
POSITIONER SURGICAL ARM (MISCELLANEOUS) ×2 IMPLANT
SET HNDPC FAN SPRY TIP SCT (DISPOSABLE) ×1 IMPLANT
STRIP CLOSURE SKIN 1/2X4 (GAUZE/BANDAGES/DRESSINGS) ×2 IMPLANT
SUCTION FRAZIER 12FR DISP (SUCTIONS) ×2 IMPLANT
SUT MNCRL AB 4-0 PS2 18 (SUTURE) ×2 IMPLANT
SUT VIC AB 2-0 CT1 27 (SUTURE) ×3
SUT VIC AB 2-0 CT1 TAPERPNT 27 (SUTURE) ×3 IMPLANT
SUT VLOC 180 0 24IN GS25 (SUTURE) ×2 IMPLANT
SYR 20CC LL (SYRINGE) ×2 IMPLANT
SYR 50ML LL SCALE MARK (SYRINGE) ×2 IMPLANT
TOWEL OR 17X26 10 PK STRL BLUE (TOWEL DISPOSABLE) ×2 IMPLANT
TOWEL OR NON WOVEN STRL DISP B (DISPOSABLE) ×2 IMPLANT
TRAY FOLEY W/METER SILVER 14FR (SET/KITS/TRAYS/PACK) ×2 IMPLANT
WATER STERILE IRR 1500ML POUR (IV SOLUTION) ×2 IMPLANT
WRAP KNEE MAXI GEL POST OP (GAUZE/BANDAGES/DRESSINGS) ×2 IMPLANT
YANKAUER SUCT BULB TIP 10FT TU (MISCELLANEOUS) ×2 IMPLANT

## 2015-01-29 NOTE — Interval H&P Note (Signed)
History and Physical Interval Note:  01/29/2015 11:42 AM  Rachel GitelmanSonja W Velez  has presented today for surgery, with the diagnosis of right knee osteoarthritis  The various methods of treatment have been discussed with the patient and family. After consideration of risks, benefits and other options for treatment, the patient has consented to  Procedure(s): RIGHT TOTAL KNEE ARTHROPLASTY (Right) as a surgical intervention .  The patient's history has been reviewed, patient examined, no change in status, stable for surgery.  I have reviewed the patient's chart and labs.  Questions were answered to the patient's satisfaction.     Loanne DrillingALUISIO,Azael Ragain V

## 2015-01-29 NOTE — Anesthesia Procedure Notes (Signed)
Spinal Patient location during procedure: OR Start time: 01/29/2015 12:21 PM End time: 01/29/2015 12:31 PM Staffing Resident/CRNA: Joniyah Mallinger Performed by: resident/CRNA  Preanesthetic Checklist Completed: patient identified, site marked, surgical consent, pre-op evaluation, timeout performed, IV checked, risks and benefits discussed and monitors and equipment checked Spinal Block Patient position: sitting Prep: Betadine Patient monitoring: heart rate, cardiac monitor, continuous pulse ox and blood pressure Approach: midline Location: L3-4 Injection technique: single-shot Needle Needle type: Spinocan  Needle gauge: 24 G Needle length: 10 cm Needle insertion depth: 7 cm Assessment Sensory level: T6 Additional Notes -heme, -para, VSS.  Lot [id number] Exp [id number]

## 2015-01-29 NOTE — Progress Notes (Signed)
Orthopedic Tech Progress Note Patient Details:  Rachel GitelmanSonja W Velez 04-13-1956 [id number]  CPM Right Knee CPM Right Knee: On Right Knee Flexion (Degrees): 40 Right Knee Extension (Degrees): 10 Additional Comments: Trapeze bar   Cammer, Mickie BailJennifer Carol 01/29/2015, 2:28 PM

## 2015-01-29 NOTE — Op Note (Signed)
Pre-operative diagnosis- Osteoarthritis  Bilateral knee(s)  Post-operative diagnosis- Osteoarthritis Bilateral knee(s)  Procedure-  Right  Total Knee Arthroplasty   Left knee cortisone injection Surgeon- Gus Rankin. Korie Brabson, MD  Assistant- Avel Peace, PA-C   Anesthesia-  Spinal  EBL-* No blood loss amount entered *   Drains Hemovac  Tourniquet time- 32 minutes @ 300 mm Hg    Complications- None  Condition-PACU - hemodynamically stable.   Brief Clinical Note  Rachel Velez is a 59 y.o. year old female with end stage OA of her right knee with progressively worsening pain and dysfunction. She has constant pain, with activity and at rest and significant functional deficits with difficulties even with ADLs. She has had extensive non-op management including analgesics, injections of cortisone and viscosupplements, and home exercise program, but remains in significant pain with significant dysfunction.Radiographs show bone on bone arthritis medial and patellofemoral. She presents now for right Total Knee Arthroplasty.  She also requests a cortisone injection in her left knee.  Procedure in detail---   The patient is brought into the operating room and positioned supine on the operating table. After successful administration of  Spinal,   a tourniquet is placed high on the  Right thigh(s) and the lower extremity is prepped and draped in the usual sterile fashion. Time out is performed by the operating team and then the  Right lower extremity is wrapped in Esmarch, knee flexed and the tourniquet inflated to 300 mmHg.       A midline incision is made with a ten blade through the subcutaneous tissue to the level of the extensor mechanism. A fresh blade is used to make a medial parapatellar arthrotomy. Soft tissue over the proximal medial tibia is subperiosteally elevated to the joint line with a knife and into the semimembranosus bursa with a Cobb elevator. Soft tissue over the proximal lateral tibia  is elevated with attention being paid to avoiding the patellar tendon on the tibial tubercle. The patella is everted, knee flexed 90 degrees and the ACL and PCL are removed. Findings are bone on bone medial and patellofemoral with large global osteophytes.        The drill is used to create a starting hole in the distal femur and the canal is thoroughly irrigated with sterile saline to remove the fatty contents. The 5 degree Right  valgus alignment guide is placed into the femoral canal and the distal femoral cutting block is pinned to remove 10 mm off the distal femur. Resection is made with an oscillating saw.      The tibia is subluxed forward and the menisci are removed. The extramedullary alignment guide is placed referencing proximally at the medial aspect of the tibial tubercle and distally along the second metatarsal axis and tibial crest. The block is pinned to remove 2mm off the more deficient medial  side. Resection is made with an oscillating saw. Size 5is the most appropriate size for the tibia and the proximal tibia is prepared with the modular drill and keel punch for that size.      The femoral sizing guide is placed and size 4 is most appropriate. Rotation is marked off the epicondylar axis and confirmed by creating a rectangular flexion gap at 90 degrees. The size 4 cutting block is pinned in this rotation and the anterior, posterior and chamfer cuts are made with the oscillating saw. The intercondylar block is then placed and that cut is made.      Trial size 5  tibial component, trial size 4 posterior stabilized femur and a 7  mm posterior stabilized rotating platform insert trial is placed. Full extension is achieved with excellent varus/valgus and anterior/posterior balance throughout full range of motion. The patella is everted and thickness measured to be 22  mm. Free hand resection is taken to 12 mm, a 38 template is placed, lug holes are drilled, trial patella is placed, and it tracks  normally. Osteophytes are removed off the posterior femur with the trial in place. All trials are removed and the cut bone surfaces prepared with pulsatile lavage. Cement is mixed and once ready for implantation, the size 5 tibial implant, size  4 posterior stabilized femoral component, and the size 38 patella are cemented in place and the patella is held with the clamp. The trial insert is placed and the knee held in full extension. The Exparel (20 ml mixed with 30 ml saline) and .25% Bupivicaine, are injected into the extensor mechanism, posterior capsule, medial and lateral gutters and subcutaneous tissues.  All extruded cement is removed and once the cement is hard the permanent 7 mm posterior stabilized rotating platform insert is placed into the tibial tray.      The wound is copiously irrigated with saline solution and the extensor mechanism closed over a hemovac drain with #1 V-loc suture. The tourniquet is released for a total tourniquet time of 32  minutes. Flexion against gravity is 140 degrees and the patella tracks normally. Subcutaneous tissue is closed with 2.0 vicryl and subcuticular with running 4.0 Monocryl. The incision is cleaned and dried and steri-strips and a bulky sterile dressing are applied. The limb is placed into a knee immobilizer.      I then prepped the left knee with Betadine and injected with 80 Mg Demedrol. She tolerated this without any difficulties  and is awakened and transported to recovery in stable condition.      Please note that a surgical assistant was a medical necessity for this procedure in order to perform it in a safe and expeditious manner. Surgical assistant was necessary to retract the ligaments and vital neurovascular structures to prevent injury to them and also necessary for proper positioning of the limb to allow for anatomic placement of the prosthesis.   Gus RankinFrank V. Sadae Arrazola, MD    01/29/2015, 1:36 PM

## 2015-01-29 NOTE — Anesthesia Preprocedure Evaluation (Signed)
Anesthesia Evaluation  Patient identified by MRN, date of birth, ID band Patient awake    Reviewed: Allergy & Precautions, NPO status , Patient's Chart, lab work & pertinent test results  History of Anesthesia Complications (+) PONV  Airway Mallampati: II  TM Distance: >3 FB Neck ROM: Full    Dental no notable dental hx.    Pulmonary neg pulmonary ROS,  breath sounds clear to auscultation  Pulmonary exam normal       Cardiovascular negative cardio ROS Normal cardiovascular examRhythm:Regular Rate:Normal     Neuro/Psych negative neurological ROS  negative psych ROS   GI/Hepatic negative GI ROS, Neg liver ROS,   Endo/Other  negative endocrine ROS  Renal/GU negative Renal ROS  negative genitourinary   Musculoskeletal negative musculoskeletal ROS (+)   Abdominal   Peds negative pediatric ROS (+)  Hematology negative hematology ROS (+)   Anesthesia Other Findings   Reproductive/Obstetrics negative OB ROS                             Anesthesia Physical Anesthesia Plan  ASA: II  Anesthesia Plan: Spinal   Post-op Pain Management:    Induction: Intravenous  Airway Management Planned: Simple Face Mask  Additional Equipment:   Intra-op Plan:   Post-operative Plan: Extubation in OR  Informed Consent: I have reviewed the patients History and Physical, chart, labs and discussed the procedure including the risks, benefits and alternatives for the proposed anesthesia with the patient or authorized representative who has indicated his/her understanding and acceptance.   Dental advisory given  Plan Discussed with: CRNA  Anesthesia Plan Comments:         Anesthesia Quick Evaluation

## 2015-01-29 NOTE — Progress Notes (Signed)
Utilization review completed.  

## 2015-01-29 NOTE — Anesthesia Postprocedure Evaluation (Signed)
  Anesthesia Post-op Note  Patient: Rachel GitelmanSonja W Velez  Procedure(s) Performed: Procedure(s) (LRB): RIGHT TOTAL KNEE ARTHROPLASTY (Right)  Patient Location: PACU  Anesthesia Type: Spinal  Level of Consciousness: awake and alert   Airway and Oxygen Therapy: Patient Spontanous Breathing  Post-op Pain: mild  Post-op Assessment: Post-op Vital signs reviewed, Patient's Cardiovascular Status Stable, Respiratory Function Stable, Patent Airway and No signs of Nausea or vomiting  Last Vitals:  Filed Vitals:   01/29/15 1834  BP: 122/80  Pulse: 63  Temp: 37.1 C  Resp: 16    Post-op Vital Signs: stable   Complications: No apparent anesthesia complications

## 2015-01-29 NOTE — Transfer of Care (Signed)
Immediate Anesthesia Transfer of Care Note  Patient: Rachel GitelmanSonja W Pratt  Procedure(s) Performed: Procedure(s): RIGHT TOTAL KNEE ARTHROPLASTY (Right)  Patient Location: PACU  Anesthesia Type:SpinalT12  Level of Consciousness:  sedated, patient cooperative and responds to stimulation  Airway & Oxygen Therapy:Patient Spontanous Breathing and Patient connected to face mask oxgen  Post-op Assessment:  Report given to PACU RN and Post -op Vital signs reviewed and stable  Post vital signs:  Reviewed and stable  Last Vitals:  Filed Vitals:   01/29/15 0943  BP: 147/93  Pulse: 82  Temp: 36.7 C  Resp: 20    Complications: No apparent anesthesia complications

## 2015-01-30 ENCOUNTER — Encounter (HOSPITAL_COMMUNITY): Payer: Self-pay | Admitting: Orthopedic Surgery

## 2015-01-30 LAB — BASIC METABOLIC PANEL
Anion gap: 5 (ref 5–15)
BUN: 9 mg/dL (ref 6–20)
CHLORIDE: 104 mmol/L (ref 101–111)
CO2: 28 mmol/L (ref 22–32)
CREATININE: 0.71 mg/dL (ref 0.44–1.00)
Calcium: 8.6 mg/dL — ABNORMAL LOW (ref 8.9–10.3)
GFR calc Af Amer: >60 mL/min (ref >60)
Glucose, Bld: 191 mg/dL — ABNORMAL HIGH (ref 65–99)
Potassium: 3.9 mmol/L (ref 3.5–5.1)
Sodium: 137 mmol/L (ref 135–145)

## 2015-01-30 LAB — CBC
HCT: 34.4 % — ABNORMAL LOW (ref 36.0–46.0)
HEMOGLOBIN: 11.2 g/dL — AB (ref 12.0–15.0)
MCH: 30.9 pg (ref 26.0–34.0)
MCHC: 32.6 g/dL (ref 30.0–36.0)
MCV: 95 fL (ref 78.0–100.0)
Platelets: 188 10*3/uL (ref 150–400)
RBC: 3.62 MIL/uL — AB (ref 3.87–5.11)
RDW: 12.5 % (ref 11.5–15.5)
WBC: 10.1 10*3/uL (ref 4.0–10.5)

## 2015-01-30 MED ORDER — SODIUM CHLORIDE 0.9 % IV BOLUS (SEPSIS)
250.0000 mL | Freq: Once | INTRAVENOUS | Status: AC
  Administered 2015-01-30: 250 mL via INTRAVENOUS

## 2015-01-30 MED ORDER — OXYCODONE HCL 5 MG PO TABS: 5.0000 mg | ORAL_TABLET | ORAL | Status: DC | PRN

## 2015-01-30 MED ORDER — RIVAROXABAN 10 MG PO TABS: 10.0000 mg | ORAL_TABLET | Freq: Every day | ORAL | Status: DC

## 2015-01-30 MED ORDER — METHOCARBAMOL 500 MG PO TABS: 500.0000 mg | ORAL_TABLET | Freq: Four times a day (QID) | ORAL | Status: DC | PRN

## 2015-01-30 MED ORDER — TRAMADOL HCL 50 MG PO TABS: 50.0000 mg | ORAL_TABLET | Freq: Four times a day (QID) | ORAL | Status: DC | PRN

## 2015-01-30 NOTE — Evaluation (Signed)
Occupational Therapy Evaluation Patient Details Name: Rachel GitelmanSonja W Velez MRN: [id number] DOB: May 17, 1956 Today's Date: 01/30/2015    History of Present Illness s/p R TKA   Clinical Impression   This 59 year old female was admitted for the above surgery.  All education was completed. No further OT is needed at this time.    Follow Up Recommendations  No OT follow up    Equipment Recommendations  None recommended by OT (wants to try standard commode; will let HHPT know if she needs 3:1)    Recommendations for Other Services       Precautions / Restrictions Precautions Precautions: Knee Restrictions Weight Bearing Restrictions: No      Mobility Bed Mobility               General bed mobility comments: oob  Transfers Overall transfer level: Needs assistance Equipment used: Rolling walker (2 wheeled) Transfers: Sit to/from Stand Sit to Stand: Supervision         General transfer comment: cues for UE/LE placement    Balance                                            ADL Overall ADL's : Needs assistance/impaired     Grooming: Oral care;Set up;Standing   Upper Body Bathing: Set up;Standing   Lower Body Bathing: Minimal assistance;Sit to/from stand   Upper Body Dressing : Set up;Sitting   Lower Body Dressing: Minimal assistance;Sit to/from stand   Toilet Transfer: Min guard;Ambulation;Comfort height toilet;Grab bars   Toileting- Clothing Manipulation and Hygiene: Modified independent;Sitting/lateral lean         General ADL Comments: pt was finishing up with ADL when I arrived. Ambulated to bathroom.  She feels she can get up from her standard commode with sink next to it. Wants to try this and will let HHPT know if she needs 3:1.  Reviewed tub options.  Pt will sponge bathe initially.  Educated on precautions and sidestepping through tight spaces as well as managing leg for bed mobility     Vision     Perception     Praxis      Pertinent Vitals/Pain Pain Assessment: 0-10 Pain Score: 2  Pain Location: R knee Pain Descriptors / Indicators: Sore Pain Intervention(s): Limited activity within patient's tolerance;Monitored during session;Premedicated before session;Repositioned (removed ice)     Hand Dominance     Extremity/Trunk Assessment Upper Extremity Assessment Upper Extremity Assessment: Overall WFL for tasks assessed           Communication Communication Communication: No difficulties   Cognition Arousal/Alertness: Awake/alert Behavior During Therapy: WFL for tasks assessed/performed Overall Cognitive Status: Within Functional Limits for tasks assessed                     General Comments       Exercises       Shoulder Instructions      Home Living Family/patient expects to be discharged to:: Private residence Living Arrangements: Children Available Help at Discharge: Family;Friend(s)               Bathroom Shower/Tub: Tub/shower unit Shower/tub characteristics: Engineer, building servicesCurtain Bathroom Toilet: Standard         Additional Comments: will stay on first floor of house      Prior Functioning/Environment Level of Independence: Independent  OT Diagnosis: Acute pain   OT Problem List:     OT Treatment/Interventions:      OT Goals(Current goals can be found in the care plan section) Acute Rehab OT Goals Patient Stated Goal: get back to being independent  OT Frequency:     Barriers to D/C:            Co-evaluation              End of Session    Activity Tolerance: Patient tolerated treatment well Patient left: in chair;with call bell/phone within reach   Time: 1016-1031 OT Time Calculation (min): 15 min Charges:  OT General Charges $OT Visit: 1 Procedure OT Evaluation $Initial OT Evaluation Tier I: 1 Procedure G-Codes:    Johnjoseph Rolfe 2015/02/02, 10:47 AM Marica Otter, OTR/L [phone] 02/02/15

## 2015-01-30 NOTE — Progress Notes (Signed)
   Subjective: 1 Day Post-Op Procedure(s) (LRB): RIGHT TOTAL KNEE ARTHROPLASTY (Right) Patient reports pain as mild.   Patient seen in rounds with Dr. Lequita HaltAluisio. Doing fine this morning. Patient is well, and has had no acute complaints or problems We will start therapy today.  Plan is to go Home after hospital stay.  Objective: Vital signs in last 24 hours: Temp:  [97.1 F (36.2 C)-98.8 F (37.1 C)] 98 F (36.7 C) (07/19 0549) Pulse Rate:  [50-82] 50 (07/19 0549) Resp:  [10-20] 16 (07/19 0549) BP: (99-158)/(58-93) 99/58 mmHg (07/19 0549) SpO2:  [100 %] 100 % (07/19 0549) Weight:  [70.308 kg (155 lb)] 70.308 kg (155 lb) (07/18 1009)  Intake/Output from previous day:  Intake/Output Summary (Last 24 hours) at 01/30/15 0849 Last data filed at 01/30/15 0837  Gross per 24 hour  Intake   4790 ml  Output   5300 ml  Net   -510 ml    Intake/Output this shift: Total I/O In: 240 [P.O.:240] Out: -   Labs:  Recent Labs  01/30/15 0418  HGB 11.2*    Recent Labs  01/30/15 0418  WBC 10.1  RBC 3.62*  HCT 34.4*  PLT 188    Recent Labs  01/30/15 0418  NA 137  K 3.9  CL 104  CO2 28  BUN 9  CREATININE 0.71  GLUCOSE 191*  CALCIUM 8.6*   No results for input(s): LABPT, INR in the last 72 hours.  EXAM General - Patient is Alert, Appropriate and Oriented Extremity - Neurovascular intact Sensation intact distally Dressing - dressing C/D/I Motor Function - intact, moving foot and toes well on exam.  Hemovac pulled without difficulty.  Past Medical History  Diagnosis Date  . Complication of anesthesia   . PONV (postoperative nausea and vomiting)   . Environmental allergies   . Arthritis   . Vertigo     Assessment/Plan: 1 Day Post-Op Procedure(s) (LRB): RIGHT TOTAL KNEE ARTHROPLASTY (Right) Principal Problem:   OA (osteoarthritis) of knee  Estimated body mass index is 27.02 kg/(m^2) as calculated from the following:   Height as of this encounter: 5' 3.5"  (1.613 m).   Weight as of this encounter: 70.308 kg (155 lb). Advance diet Up with therapy Plan for discharge tomorrow Discharge home with home health  Extra fluid this morning.  DVT Prophylaxis - Xarelto Weight-Bearing as tolerated to right leg D/C O2 and Pulse OX and try on Room Air  Avel Peacerew Rosaleen Mazer, PA-C Orthopaedic Surgery 01/30/2015, 8:49 AM

## 2015-01-30 NOTE — Care Management Note (Signed)
Case Management Note  Patient Details  Name: Rachel Velez MRN: [id number] Date of Birth: 13-Nov-1955  Subjective/Objective:                   RIGHT TOTAL KNEE ARTHROPLASTY (Right) Action/Plan:  Discharge planning Expected Discharge Date:  01/31/15               Expected Discharge Plan:  [place]  In-House Referral:     Discharge planning Services  CM Consult  Post Acute Care Choice:  Home Health Choice offered to:     DME Arranged:    DME Agency:     HH Arranged:  PT HH Agency:  [facility]  Status of Service:  Completed, signed off  Medicare Important Message Given:    Date Medicare IM Given:    Medicare IM give by:    Date Additional Medicare IM Given:    Additional Medicare Important Message give by:     If discussed at [place] of Stay Meetings, dates discussed:    Additional Comments: CM met with pt in room to offer choice of home health agency.  Pt chooses [facility] to render HHPT.  Address and contact information verified by pt. Referral accepted by [facility] rep, Tim (on unit).  CM called [facility] DME rep, Lecretia to please deliver a rolling walker to room prior to discharge.  No other CM needs were communicated. Dellie Catholic, RN 01/30/2015, 3:10 PM

## 2015-01-30 NOTE — Progress Notes (Signed)
Physical Therapy Treatment Patient Details Name: Rachel Velez MRN: [id number] DOB: 1956-05-31 Today's Date: 01/30/2015    History of Present Illness s/p R TKA    PT Comments    Progressing well, ambulated x 400'. Plans DC tomorrow.  Follow Up Recommendations  Home health PT;Supervision/Assistance - 24 hour     Equipment Recommendations  Rolling walker with 5" wheels    Recommendations for Other Services       Precautions / Restrictions Precautions Precautions: Knee Required Braces or Orthoses: Knee Immobilizer - Right Knee Immobilizer - Right: Discontinue once straight leg raise with < 10 degree lag    Mobility  Bed Mobility Overal bed mobility: Needs Assistance Bed Mobility: Sit to Supine       Sit to supine: Supervision   General bed mobility comments: able to place R leg onto bed  Transfers Overall transfer level: Needs assistance Equipment used: Rolling walker (2 wheeled) Transfers: Sit to/from Stand Sit to Stand: Supervision         General transfer comment: cues for UE/LE placement  Ambulation/Gait Ambulation/Gait assistance: Min guard Ambulation Distance (Feet): 400 Feet Assistive device: Rolling walker (2 wheeled) Gait Pattern/deviations: Step-to pattern;Step-through pattern;Antalgic     General Gait Details: cues for sequence   Stairs            Wheelchair Mobility    Modified Rankin (Stroke Patients Only)       Balance                                    Cognition Arousal/Alertness: Awake/alert Behavior During Therapy: WFL for tasks assessed/performed Overall Cognitive Status: Within Functional Limits for tasks assessed                      Exercises Total Joint Exercises Ankle Circles/Pumps: AROM;Both;10 reps;Supine Quad Sets: AROM;Both;10 reps;Supine Towel Squeeze: AROM;Right;10 reps;Supine Heel Slides: AROM;Right;10 reps;Supine Hip ABduction/ADduction: AROM;Right;10 reps;Supine Straight Leg  Raises: AAROM;Right;10 reps;Supine    General Comments        Pertinent Vitals/Pain Pain Score: 4  Pain Location: R knee Pain Descriptors / Indicators: Aching Pain Intervention(s): Monitored during session;Premedicated before session;Ice applied    Home Living Family/patient expects to be discharged to:: Private residence   Available Help at Discharge: Family;Friend(s) Type of Home: House Home Access: Stairs to enter   Home Layout: One level   Additional Comments: will stay on first floor of house    Prior Function Level of Independence: Independent          PT Goals (current goals can now be found in the care plan section) Acute Rehab PT Goals Patient Stated Goal: get back to being independent PT Goal Formulation: With patient Time For Goal Achievement: 02/02/15 Potential to Achieve Goals: Good Progress towards PT goals: Progressing toward goals    Frequency  7X/week    PT Plan Current plan remains appropriate    Co-evaluation             End of Session Equipment Utilized During Treatment: Right knee immobilizer Activity Tolerance: Patient tolerated treatment well Patient left: in bed;with call bell/phone within reach     Time: 1432-1502 PT Time Calculation (min) (ACUTE ONLY): 30 min  Charges:  $Gait Training: 8-22 mins $Therapeutic Exercise: 8-22 mins                    G Codes:  Rachel Velez, Rachel Velez 01/30/2015, 3:33 PM

## 2015-01-30 NOTE — Discharge Summary (Signed)
Physician Discharge Summary   Patient ID: Rachel Velez MRN: [id number] DOB/AGE: 18-Jul-1955 59 y.o.  Admit date: 01/29/2015 Discharge date: 01/31/2015  Primary Diagnosis:  Osteoarthritis Bilateral knee(s)  Admission Diagnoses:  Past Medical History  Diagnosis Date  . Complication of anesthesia   . PONV (postoperative nausea and vomiting)   . Environmental allergies   . Arthritis   . Vertigo    Discharge Diagnoses:   Principal Problem:   OA (osteoarthritis) of knee  Estimated body mass index is 27.02 kg/(m^2) as calculated from the following:   Height as of this encounter: 5' 3.5" (1.613 m).   Weight as of this encounter: 70.308 kg (155 lb).  Procedure:  Procedure(s) (LRB): RIGHT TOTAL KNEE ARTHROPLASTY (Right)   Consults: None  HPI: Rachel Velez is a 59 y.o. year old female with end stage OA of her right knee with progressively worsening pain and dysfunction. She has constant pain, with activity and at rest and significant functional deficits with difficulties even with ADLs. She has had extensive non-op management including analgesics, injections of cortisone and viscosupplements, and home exercise program, but remains in significant pain with significant dysfunction.Radiographs show bone on bone arthritis medial and patellofemoral. She presents now for right Total Knee Arthroplasty. She also requests a cortisone injection in her left knee.  Laboratory Data: Admission on 01/29/2015  Component Date Value Ref Range Status  . WBC 01/30/2015 10.1  4.0 - 10.5 K/uL Final  . RBC 01/30/2015 3.62* 3.87 - 5.11 MIL/uL Final  . Hemoglobin 01/30/2015 11.2* 12.0 - 15.0 g/dL Final  . HCT 01/30/2015 34.4* 36.0 - 46.0 % Final  . MCV 01/30/2015 95.0  78.0 - 100.0 fL Final  . MCH 01/30/2015 30.9  26.0 - 34.0 pg Final  . MCHC 01/30/2015 32.6  30.0 - 36.0 g/dL Final  . RDW 01/30/2015 12.5  11.5 - 15.5 % Final  . Platelets 01/30/2015 188  150 - 400 K/uL Final  . Sodium 01/30/2015 137   135 - 145 mmol/L Final  . Potassium 01/30/2015 3.9  3.5 - 5.1 mmol/L Final  . Chloride 01/30/2015 104  101 - 111 mmol/L Final  . CO2 01/30/2015 28  22 - 32 mmol/L Final  . Glucose, Bld 01/30/2015 191* 65 - 99 mg/dL Final  . BUN 01/30/2015 9  6 - 20 mg/dL Final  . Creatinine, Ser 01/30/2015 0.71  0.44 - 1.00 mg/dL Final  . Calcium 01/30/2015 8.6* 8.9 - 10.3 mg/dL Final  . GFR calc non Af Amer 01/30/2015 >60  >60 mL/min Final  . GFR calc Af Amer 01/30/2015 >60  >60 mL/min Final   Comment: (NOTE) The eGFR has been calculated using the CKD EPI equation. This calculation has not been validated in all clinical situations. eGFR's persistently <60 mL/min signify possible Chronic Kidney Disease.   Georgiann Hahn gap 01/30/2015 5  5 - 15 Final  Hospital Outpatient Visit on 01/22/2015  Component Date Value Ref Range Status  . aPTT 01/22/2015 27  24 - 37 seconds Final  . WBC 01/22/2015 5.2  4.0 - 10.5 K/uL Final  . RBC 01/22/2015 4.18  3.87 - 5.11 MIL/uL Final  . Hemoglobin 01/22/2015 12.6  12.0 - 15.0 g/dL Final  . HCT 01/22/2015 39.3  36.0 - 46.0 % Final  . MCV 01/22/2015 94.0  78.0 - 100.0 fL Final  . MCH 01/22/2015 30.1  26.0 - 34.0 pg Final  . MCHC 01/22/2015 32.1  30.0 - 36.0 g/dL Final  . RDW 01/22/2015 12.7  11.5 - 15.5 % Final  . Platelets 01/22/2015 203  150 - 400 K/uL Final  . Sodium 01/22/2015 142  135 - 145 mmol/L Final  . Potassium 01/22/2015 3.7  3.5 - 5.1 mmol/L Final  . Chloride 01/22/2015 104  101 - 111 mmol/L Final  . CO2 01/22/2015 27  22 - 32 mmol/L Final  . Glucose, Bld 01/22/2015 88  65 - 99 mg/dL Final  . BUN 01/22/2015 14  6 - 20 mg/dL Final  . Creatinine, Ser 01/22/2015 0.78  0.44 - 1.00 mg/dL Final  . Calcium 01/22/2015 9.2  8.9 - 10.3 mg/dL Final  . Total Protein 01/22/2015 7.0  6.5 - 8.1 g/dL Final  . Albumin 01/22/2015 4.3  3.5 - 5.0 g/dL Final  . AST 01/22/2015 20  15 - 41 U/L Final  . ALT 01/22/2015 14  14 - 54 U/L Final  . Alkaline Phosphatase 01/22/2015 43  38  - 126 U/L Final  . Total Bilirubin 01/22/2015 0.7  0.3 - 1.2 mg/dL Final  . GFR calc non Af Amer 01/22/2015 >60  >60 mL/min Final  . GFR calc Af Amer 01/22/2015 >60  >60 mL/min Final   Comment: (NOTE) The eGFR has been calculated using the CKD EPI equation. This calculation has not been validated in all clinical situations. eGFR's persistently <60 mL/min signify possible Chronic Kidney Disease.   . Anion gap 01/22/2015 11  5 - 15 Final  . Prothrombin Time 01/22/2015 12.8  11.6 - 15.2 seconds Final  . INR 01/22/2015 0.94  0.00 - 1.49 Final  . ABO/RH(D) 01/22/2015 O POS   Final  . Antibody Screen 01/22/2015 NEG   Final  . Sample Expiration 01/22/2015 02/01/2015   Final  . Color, Urine 01/22/2015 YELLOW  YELLOW Final  . APPearance 01/22/2015 CLEAR  CLEAR Final  . Specific Gravity, Urine 01/22/2015 1.006  1.005 - 1.030 Final  . pH 01/22/2015 6.0  5.0 - 8.0 Final  . Glucose, UA 01/22/2015 NEGATIVE  NEGATIVE mg/dL Final  . Hgb urine dipstick 01/22/2015 NEGATIVE  NEGATIVE Final  . Bilirubin Urine 01/22/2015 NEGATIVE  NEGATIVE Final  . Ketones, ur 01/22/2015 NEGATIVE  NEGATIVE mg/dL Final  . Protein, ur 01/22/2015 NEGATIVE  NEGATIVE mg/dL Final  . Urobilinogen, UA 01/22/2015 0.2  0.0 - 1.0 mg/dL Final  . Nitrite 01/22/2015 NEGATIVE  NEGATIVE Final  . Leukocytes, UA 01/22/2015 NEGATIVE  NEGATIVE Final   MICROSCOPIC NOT DONE ON URINES WITH NEGATIVE PROTEIN, BLOOD, LEUKOCYTES, NITRITE, OR GLUCOSE <1000 mg/dL.  Marland Kitchen MRSA, PCR 01/22/2015 NEGATIVE  NEGATIVE Final  . Staphylococcus aureus 01/22/2015 NEGATIVE  NEGATIVE Final   Comment:        The Xpert SA Assay (FDA approved for NASAL specimens in patients over 18 years of age), is one component of a comprehensive surveillance program.  Test performance has been validated by [facility] for patients greater than or equal to 68 year old. It is not intended to diagnose infection nor to guide or monitor treatment.   . ABO/RH(D) 01/22/2015 O POS    Final     X-Rays:No results found.  EKG:No orders found for this or any previous visit.   Hospital Course: Rachel Velez is a 59 y.o. who was admitted to [facility]. They were brought to the operating room on 01/29/2015 and underwent Procedure(s): RIGHT TOTAL KNEE ARTHROPLASTY.  Patient tolerated the procedure well and was later transferred to the recovery room and then to the orthopaedic floor for postoperative care.  They  were given PO and IV analgesics for pain control following their surgery.  They were given 24 hours of postoperative antibiotics of  Anti-infectives    Start     Dose/Rate Route Frequency Ordered Stop   01/30/15 0000  vancomycin (VANCOCIN) IVPB 1000 mg/200 mL premix     1,000 mg 200 mL/hr over 60 Minutes Intravenous Every 12 hours 01/29/15 1533 01/30/15 0011   01/29/15 0953  vancomycin (VANCOCIN) IVPB 1000 mg/200 mL premix     1,000 mg 200 mL/hr over 60 Minutes Intravenous On call to O.R. 01/29/15 7616 01/29/15 1223     and started on DVT prophylaxis in the form of Xarelto.   PT and OT were ordered for total joint protocol.  Discharge planning consulted to help with postop disposition and equipment needs.  Patient had a good night on the evening of surgery.  They started to get up OOB with therapy on day one. Hemovac drain was pulled without difficulty.  Continued to work with therapy into day two.  Dressing was changed on day two and the incision was healing well.   Patient was seen in rounds and was ready to go home.   Diet: Regular diet Activity:WBAT Follow-up:in 2 weeks Disposition - Home Discharged Condition: good   Discharge Instructions    Call MD / Call 911    Complete by:  As directed   If you experience chest pain or shortness of breath, CALL 911 and be transported to the hospital emergency room.  If you develope a fever above 101 F, pus (white drainage) or increased drainage or redness at the wound, or calf pain, call your surgeon's office.       Change dressing    Complete by:  As directed   Change dressing daily with sterile 4 x 4 inch gauze dressing and apply TED hose. Do not submerge the incision under water.     Constipation Prevention    Complete by:  As directed   Drink plenty of fluids.  Prune juice may be helpful.  You may use a stool softener, such as Colace (over the counter) 100 mg twice a day.  Use MiraLax (over the counter) for constipation as needed.     Diet general    Complete by:  As directed      Discharge instructions    Complete by:  As directed   Pick up stool softner and laxative for home use following surgery while on pain medications. Do not submerge incision under water. Please use good hand washing techniques while changing dressing each day. May shower starting three days after surgery. Please use a clean towel to pat the incision dry following showers. Continue to use ice for pain and swelling after surgery. Do not use any lotions or creams on the incision until instructed by your surgeon.  Take Xarelto for two and a half more weeks, then discontinue Xarelto. Once the patient has completed the blood thinner regimen, then take a Baby 81 mg Aspirin daily for three more weeks.  Postoperative Constipation Protocol  Constipation - defined medically as fewer than three stools per week and severe constipation as less than one stool per week.  One of the most common issues patients have following surgery is constipation.  Even if you have a regular bowel pattern at home, your normal regimen is likely to be disrupted due to multiple reasons following surgery.  Combination of anesthesia, postoperative narcotics, change in appetite and fluid intake all can affect your bowels.  In order to avoid complications following surgery, here are some recommendations in order to help you during your recovery period.  Colace (docusate) - Pick up an over-the-counter form of Colace or another stool softener and take twice a  day as long as you are requiring postoperative pain medications.  Take with a full glass of water daily.  If you experience loose stools or diarrhea, hold the colace until you stool forms back up.  If your symptoms do not get better within 1 week or if they get worse, check with your doctor.  Dulcolax (bisacodyl) - Pick up over-the-counter and take as directed by the product packaging as needed to assist with the movement of your bowels.  Take with a full glass of water.  Use this product as needed if not relieved by Colace only.   MiraLax (polyethylene glycol) - Pick up over-the-counter to have on hand.  MiraLax is a solution that will increase the amount of water in your bowels to assist with bowel movements.  Take as directed and can mix with a glass of water, juice, soda, coffee, or tea.  Take if you go more than two days without a movement. Do not use MiraLax more than once per day. Call your doctor if you are still constipated or irregular after using this medication for 7 days in a row.  If you continue to have problems with postoperative constipation, please contact the office for further assistance and recommendations.  If you experience "the worst abdominal pain ever" or develop nausea or vomiting, please contact the office immediatly for further recommendations for treatment.     Do not put a pillow under the knee. Place it under the heel.    Complete by:  As directed      Do not sit on low chairs, stoools or toilet seats, as it may be difficult to get up from low surfaces    Complete by:  As directed      Driving restrictions    Complete by:  As directed   No driving until released by the physician.     Increase activity slowly as tolerated    Complete by:  As directed      Lifting restrictions    Complete by:  As directed   No lifting until released by the physician.     Patient may shower    Complete by:  As directed   You may shower without a dressing once there is no drainage.   Do not wash over the wound.  If drainage remains, do not shower until drainage stops.     TED hose    Complete by:  As directed   Use stockings (TED hose) for 3 weeks on both leg(s).  You may remove them at night for sleeping.     Weight bearing as tolerated    Complete by:  As directed   Laterality:  right  Extremity:  Lower            Medication List    STOP taking these medications        CALCIUM-MAGNESIUM-ZINC PO     cholecalciferol 1000 UNITS tablet  Commonly known as:  VITAMIN D     glucosamine-chondroitin 500-400 MG tablet     MINIVELLE 0.05 MG/24HR patch  Generic drug:  estradiol     naproxen sodium 220 MG tablet  Commonly known as:  ANAPROX     Omega 3 1000 MG Caps     vitamin C  500 MG tablet  Commonly known as:  ASCORBIC ACID      TAKE these medications        methocarbamol 500 MG tablet  Commonly known as:  ROBAXIN  Take 1 tablet (500 mg total) by mouth every 6 (six) hours as needed for muscle spasms.     oxyCODONE 5 MG immediate release tablet  Commonly known as:  Oxy IR/ROXICODONE  Take 1-2 tablets (5-10 mg total) by mouth every 3 (three) hours as needed for moderate pain or severe pain.     rivaroxaban 10 MG Tabs tablet  Commonly known as:  XARELTO  Take 1 tablet (10 mg total) by mouth daily with breakfast. Take Xarelto for two and a half more weeks, then discontinue Xarelto. Once the patient has completed the blood thinner regimen, then take a Baby 81 mg Aspirin daily for three more weeks.     traMADol 50 MG tablet  Commonly known as:  ULTRAM  Take 1-2 tablets (50-100 mg total) by mouth every 6 (six) hours as needed (mild pain).           Follow-up Information    Follow up with [facility].   Why:  HOME HEALTH PHYSICAL THERAPY   Contact information:   [address] [place] [address] [phone]       Follow up with [facility].   Why:  rolling walker   Contact information:   [address] [place] [address] [phone]       Follow up with Gearlean Alf, MD. Schedule an appointment as soon as possible for a visit on 02/13/2015.   Specialty:  Orthopedic Surgery   Why:  Call office at [phone] to setup followup appointment on Tuesday 02/13/2015 with Dr. Wynelle Link.   Contact information:   [address] [place] [address] [phone]       Signed: Arlee Muslim, PA-C Orthopaedic Surgery 01/30/2015, 9:29 PM

## 2015-01-30 NOTE — Discharge Instructions (Addendum)
° °Dr. Frank Aluisio °Total Joint Specialist °[facility] °[address] °[place], [place] [address] °[phone] ° °TOTAL KNEE REPLACEMENT POSTOPERATIVE DIRECTIONS ° °Knee Rehabilitation, Guidelines Following Surgery  °Results after knee surgery are often greatly improved when you follow the exercise, range of motion and muscle strengthening exercises prescribed by your doctor. Safety measures are also important to protect the knee from further injury. Any time any of these exercises cause you to have increased pain or swelling in your knee joint, decrease the amount until you are comfortable again and slowly increase them. If you have problems or questions, call your caregiver or physical therapist for advice.  ° °HOME CARE INSTRUCTIONS  °Remove items at home which could result in a fall. This includes throw rugs or furniture in walking pathways.  °· ICE to the affected knee every three hours for 30 minutes at a time and then as needed for pain and swelling.  Continue to use ice on the knee for pain and swelling from surgery. You may notice swelling that will progress down to the foot and ankle.  This is normal after surgery.  Elevate the leg when you are not up walking on it.   °· Continue to use the breathing machine which will help keep your temperature down.  It is common for your temperature to cycle up and down following surgery, especially at night when you are not up moving around and exerting yourself.  The breathing machine keeps your lungs expanded and your temperature down. °· Do not place pillow under knee, focus on keeping the knee straight while resting ° °DIET °You may resume your previous home diet once your are discharged from the hospital. ° °DRESSING / WOUND CARE / SHOWERING °You may shower 3 days after surgery, but keep the wounds dry during showering.  You may use an occlusive plastic wrap (Press'n Seal for example), NO SOAKING/SUBMERGING IN THE BATHTUB.  If the  bandage gets wet, change with a clean dry gauze.  If the incision gets wet, pat the wound dry with a clean towel. °You may start showering once you are discharged home but do not submerge the incision under water. Just pat the incision dry and apply a dry gauze dressing on daily. °Change the surgical dressing daily and reapply a dry dressing each time. ° °ACTIVITY °Walk with your walker as instructed. °Use walker as long as suggested by your caregivers. °Avoid periods of inactivity such as sitting longer than an hour when not asleep. This helps prevent blood clots.  °You may resume a sexual relationship in one month or when given the OK by your doctor.  °You may return to work once you are cleared by your doctor.  °Do not drive a car for 6 weeks or until released by you surgeon.  °Do not drive while taking narcotics. ° °WEIGHT BEARING °Weight bearing as tolerated with assist device (walker, cane, etc) as directed, use it as long as suggested by your surgeon or therapist, typically at least 4-6 weeks. ° °POSTOPERATIVE CONSTIPATION PROTOCOL °Constipation - defined medically as fewer than three stools per week and severe constipation as less than one stool per week. ° °One of the most common issues patients have following surgery is constipation.  Even if you have a regular bowel pattern at home, your normal regimen is likely to be disrupted due to multiple reasons following surgery.  Combination of anesthesia, postoperative narcotics, change in appetite and fluid intake all can affect your bowels.    In order to avoid complications following surgery, here are some recommendations in order to help you during your recovery period. ° °Colace (docusate) - Pick up an over-the-counter form of Colace or another stool softener and take twice a day as long as you are requiring postoperative pain medications.  Take with a full glass of water daily.  If you experience loose stools or diarrhea, hold the colace until you stool forms  back up.  If your symptoms do not get better within 1 week or if they get worse, check with your doctor. ° °Dulcolax (bisacodyl) - Pick up over-the-counter and take as directed by the product packaging as needed to assist with the movement of your bowels.  Take with a full glass of water.  Use this product as needed if not relieved by Colace only.  ° °MiraLax (polyethylene glycol) - Pick up over-the-counter to have on hand.  MiraLax is a solution that will increase the amount of water in your bowels to assist with bowel movements.  Take as directed and can mix with a glass of water, juice, soda, coffee, or tea.  Take if you go more than two days without a movement. °Do not use MiraLax more than once per day. Call your doctor if you are still constipated or irregular after using this medication for 7 days in a row. ° °If you continue to have problems with postoperative constipation, please contact the office for further assistance and recommendations.  If you experience "the worst abdominal pain ever" or develop nausea or vomiting, please contact the office immediatly for further recommendations for treatment. ° °ITCHING ° If you experience itching with your medications, try taking only a single pain pill, or even half a pain pill at a time.  You can also use Benadryl over the counter for itching or also to help with sleep.  ° °TED HOSE STOCKINGS °Wear the elastic stockings on both legs for three weeks following surgery during the day but you may remove then at night for sleeping. ° °MEDICATIONS °See your medication summary on the “After Visit Summary” that the nursing staff will review with you prior to discharge.  You may have some home medications which will be placed on hold until you complete the course of blood thinner medication.  It is important for you to complete the blood thinner medication as prescribed by your surgeon.  Continue your approved medications as instructed at time of  discharge. ° °PRECAUTIONS °If you experience chest pain or shortness of breath - call 911 immediately for transfer to the hospital emergency department.  °If you develop a fever greater that 101 F, purulent drainage from wound, increased redness or drainage from wound, foul odor from the wound/dressing, or calf pain - CONTACT YOUR SURGEON.   °                                                °FOLLOW-UP APPOINTMENTS °Make sure you keep all of your appointments after your operation with your surgeon and caregivers. You should call the office at the above phone number and make an appointment for approximately two weeks after the date of your surgery or on the date instructed by your surgeon outlined in the "After Visit Summary". ° ° °RANGE OF MOTION AND STRENGTHENING EXERCISES  °Rehabilitation of the knee is important following a knee injury or   an operation. After just a few days of immobilization, the muscles of the thigh which control the knee become weakened and shrink (atrophy). Knee exercises are designed to build up the tone and strength of the thigh muscles and to improve knee motion. Often times heat used for twenty to thirty minutes before working out will loosen up your tissues and help with improving the range of motion but do not use heat for the first two weeks following surgery. These exercises can be done on a training (exercise) mat, on the floor, on a table or on a bed. Use what ever works the best and is most comfortable for you Knee exercises include:  °Leg Lifts - While your knee is still immobilized in a splint or cast, you can do straight leg raises. Lift the leg to 60 degrees, hold for 3 sec, and slowly lower the leg. Repeat 10-20 times 2-3 times daily. Perform this exercise against resistance later as your knee gets better.  °Quad and Hamstring Sets - Tighten up the muscle on the front of the thigh (Quad) and hold for 5-10 sec. Repeat this 10-20 times hourly. Hamstring sets are done by pushing the  foot backward against an object and holding for 5-10 sec. Repeat as with quad sets.  °· Leg Slides: Lying on your back, slowly slide your foot toward your buttocks, bending your knee up off the floor (only go as far as is comfortable). Then slowly slide your foot back down until your leg is flat on the floor again. °· Angel Wings: Lying on your back spread your legs to the side as far apart as you can without causing discomfort.  °A rehabilitation program following serious knee injuries can speed recovery and prevent re-injury in the future due to weakened muscles. Contact your doctor or a physical therapist for more information on knee rehabilitation.  ° °IF YOU ARE TRANSFERRED TO A SKILLED REHAB FACILITY °If the patient is transferred to a skilled rehab facility following release from the hospital, a list of the current medications will be sent to the facility for the patient to continue.  When discharged from the skilled rehab facility, please have the facility set up the patient's Home Health Physical Therapy prior to being released. Also, the skilled facility will be responsible for providing the patient with their medications at time of release from the facility to include their pain medication, the muscle relaxants, and their blood thinner medication. If the patient is still at the rehab facility at time of the two week follow up appointment, the skilled rehab facility will also need to assist the patient in arranging follow up appointment in our office and any transportation needs. ° °MAKE SURE YOU:  °Understand these instructions.  °Get help right away if you are not doing well or get worse.  ° ° °Pick up stool softner and laxative for home use following surgery while on pain medications. °Do not submerge incision under water. °Please use good hand washing techniques while changing dressing each day. °May shower starting three days after surgery. °Please use a clean towel to pat the incision dry following  showers. °Continue to use ice for pain and swelling after surgery. °Do not use any lotions or creams on the incision until instructed by your surgeon.\ ° °Take Xarelto for two and a half more weeks, then discontinue Xarelto. °Once the patient has completed the blood thinner regimen, then take a Baby 81 mg Aspirin daily for three more weeks. ° ° °Information   on my medicine - XARELTO® (Rivaroxaban) ° °This medication education was reviewed with me or my healthcare representative as part of my discharge preparation.  The pharmacist that spoke with me during my hospital stay was:  Legge, Justin Marshall, RPH ° °Why was Xarelto® prescribed for you? °Xarelto® was prescribed for you to reduce the risk of blood clots forming after orthopedic surgery. The medical term for these abnormal blood clots is venous thromboembolism (VTE). ° °What do you need to know about xarelto® ? °Take your Xarelto® ONCE DAILY at the same time every day. °You may take it either with or without food. ° °If you have difficulty swallowing the tablet whole, you may crush it and mix in applesauce just prior to taking your dose. ° °Take Xarelto® exactly as prescribed by your doctor and DO NOT stop taking Xarelto® without talking to the doctor who prescribed the medication.  Stopping without other VTE prevention medication to take the place of Xarelto® may increase your risk of developing a clot. ° °After discharge, you should have regular check-up appointments with your healthcare provider that is prescribing your Xarelto®.   ° °What do you do if you miss a dose? °If you miss a dose, take it as soon as you remember on the same day then continue your regularly scheduled once daily regimen the next day. Do not take two doses of Xarelto® on the same day.  ° °Important Safety Information °A possible side effect of Xarelto® is bleeding. You should call your healthcare provider right away if you experience any of the following: °? Bleeding from an injury or  your nose that does not stop. °? Unusual colored urine (red or dark brown) or unusual colored stools (red or black). °? Unusual bruising for unknown reasons. °? A serious fall or if you hit your head (even if there is no bleeding). ° °Some medicines may interact with Xarelto® and might increase your risk of bleeding while on Xarelto®. To help avoid this, consult your healthcare provider or pharmacist prior to using any new prescription or non-prescription medications, including herbals, vitamins, non-steroidal anti-inflammatory drugs (NSAIDs) and supplements. ° °This website has more information on Xarelto®: www.xarelto.com. ° ° ° °

## 2015-01-30 NOTE — Evaluation (Signed)
Physical Therapy Evaluation Patient Details Name: Rachel GitelmanSonja W Velez MRN: [id number] DOB: Nov 30, 1955 Today's Date: 01/30/2015   History of Present Illness  s/p R TKA  Clinical Impression  Patient is progressing well . Minimal pain. Patient will benefit from PT to address problems listed in note below to return to independence.    Follow Up Recommendations Home health PT;Supervision/Assistance - 24 hour    Equipment Recommendations  Rolling walker with 5" wheels    Recommendations for Other Services       Precautions / Restrictions Precautions Precautions: Knee Required Braces or Orthoses: Knee Immobilizer - Right Knee Immobilizer - Right: Discontinue once straight leg raise with < 10 degree lag      Mobility  Bed Mobility               General bed mobility comments: oob  Transfers Overall transfer level: Needs assistance Equipment used: Rolling walker (2 wheeled) Transfers: Sit to/from Stand Sit to Stand: Supervision         General transfer comment: cues for UE/LE placement  Ambulation/Gait Ambulation/Gait assistance: Min guard Ambulation Distance (Feet): 250 Feet Assistive device: Rolling walker (2 wheeled) Gait Pattern/deviations: Step-to pattern;Step-through pattern;Antalgic     General Gait Details: cues for sequence  Stairs            Wheelchair Mobility    Modified Rankin (Stroke Patients Only)       Balance                                             Pertinent Vitals/Pain Pain Score: 4  Pain Location: R knee Pain Descriptors / Indicators: Sore Pain Intervention(s): Limited activity within patient's tolerance;Monitored during session;Premedicated before session;Repositioned;Ice applied    Home Living Family/patient expects to be discharged to:: Private residence   Available Help at Discharge: Family;Friend(s) Type of Home: House Home Access: Stairs to enter   Secretary/administratorntrance Stairs-Number of Steps: 1 Home Layout:  One level   Additional Comments: will stay on first floor of house    Prior Function Level of Independence: Independent               Hand Dominance        Extremity/Trunk Assessment               Lower Extremity Assessment: RLE deficits/detail RLE Deficits / Details: knee flexion45    Cervical / Trunk Assessment: Normal  Communication   Communication: No difficulties  Cognition Arousal/Alertness: Awake/alert Behavior During Therapy: WFL for tasks assessed/performed Overall Cognitive Status: Within Functional Limits for tasks assessed                      General Comments      Exercises Total Joint Exercises Ankle Circles/Pumps: AROM;Both;10 reps;Supine Quad Sets: AROM;Both;10 reps;Supine Towel Squeeze: AROM;Right;10 reps;Supine Heel Slides: AROM;Right;10 reps;Supine Hip ABduction/ADduction: AROM;Right;10 reps;Supine Straight Leg Raises: AAROM;Right;10 reps;Supine      Assessment/Plan    PT Assessment Patient needs continued PT services  PT Diagnosis Difficulty walking;Acute pain   PT Problem List Decreased strength;Decreased range of motion;Decreased activity tolerance;Decreased mobility;Decreased knowledge of precautions;Pain;Decreased safety awareness;Decreased knowledge of use of DME  PT Treatment Interventions DME instruction;Gait training;Stair training;Functional mobility training;Therapeutic exercise;Patient/family education   PT Goals (Current goals can be found in the Care Plan section) Acute Rehab PT Goals Patient Stated Goal: get back to being independent  PT Goal Formulation: With patient Time For Goal Achievement: 02/02/15 Potential to Achieve Goals: Good    Frequency 7X/week   Barriers to discharge        Co-evaluation               End of Session Equipment Utilized During Treatment: Right knee immobilizer Activity Tolerance: Patient tolerated treatment well Patient left: in chair;with call bell/phone within  reach Nurse Communication: Mobility status         Time: 1107-1130 PT Time Calculation (min) (ACUTE ONLY): 23 min   Charges:   PT Evaluation $Initial PT Evaluation Tier I: 1 Procedure PT Treatments $Gait Training: 8-22 mins   PT G Codes:        Rada Hay 01/30/2015, 3:23 PM  Blanchard Kelch PT [phone]

## 2015-01-31 LAB — BASIC METABOLIC PANEL
ANION GAP: 4 — AB (ref 5–15)
BUN: 12 mg/dL (ref 6–20)
CO2: 31 mmol/L (ref 22–32)
Calcium: 8.6 mg/dL — ABNORMAL LOW (ref 8.9–10.3)
Chloride: 106 mmol/L (ref 101–111)
Creatinine, Ser: 0.7 mg/dL (ref 0.44–1.00)
GFR calc Af Amer: >60 mL/min (ref >60)
Glucose, Bld: 128 mg/dL — ABNORMAL HIGH (ref 65–99)
Potassium: 3.9 mmol/L (ref 3.5–5.1)
Sodium: 141 mmol/L (ref 135–145)

## 2015-01-31 LAB — CBC
HCT: 30.9 % — ABNORMAL LOW (ref 36.0–46.0)
Hemoglobin: 10.1 g/dL — ABNORMAL LOW (ref 12.0–15.0)
MCH: 31.2 pg (ref 26.0–34.0)
MCHC: 32.7 g/dL (ref 30.0–36.0)
MCV: 95.4 fL (ref 78.0–100.0)
Platelets: 157 10*3/uL (ref 150–400)
RBC: 3.24 MIL/uL — ABNORMAL LOW (ref 3.87–5.11)
RDW: 12.6 % (ref 11.5–15.5)
WBC: 12 10*3/uL — ABNORMAL HIGH (ref 4.0–10.5)

## 2015-01-31 NOTE — Progress Notes (Signed)
Physical Therapy Treatment Patient Details Name: Rachel GitelmanSonja W Velez MRN: [id number] DOB: Nov 22, 1955 Today's Date: 01/31/2015    History of Present Illness s/p R TKA    PT Comments    Pat again with body shakes, c/o nausea while walking. RN in and gave meds. Did practice 2 steps with spouse. DC pending.  Follow Up Recommendations  Home health PT;Supervision/Assistance - 24 hour     Equipment Recommendations  Rolling walker with 5" wheels    Recommendations for Other Services       Precautions / Restrictions Precautions Precautions: Knee Required Braces or Orthoses: Knee Immobilizer - Right    Mobility  Bed Mobility   Bed Mobility: Sit to Supine       Sit to supine: Min guard   General bed mobility comments: support R leg  Transfers   Equipment used: Rolling walker (2 wheeled) Transfers: Sit to/from Stand Sit to Stand: Supervision         General transfer comment: cues for UE/LE placement  Ambulation/Gait Ambulation/Gait assistance: Min guard Ambulation Distance (Feet): 25 Feet Assistive device: Rolling walker (2 wheeled) Gait Pattern/deviations: Step-to pattern     General Gait Details: pt with c/o nausea after practicing stairs.   Stairs Stairs: Yes Stairs assistance: Min assist Stair Management: Step to pattern;Backwards;With walker Number of Stairs: 2 General stair comments: family instructions.  Wheelchair Mobility    Modified Rankin (Stroke Patients Only)       Balance                                    Cognition Arousal/Alertness: Awake/alert Behavior During Therapy:  (having uncontrollable shaking)                        Exercises Total Joint Exercises Ankle Circles/Pumps: AROM;Both;10 reps;Supine Heel Slides: AAROM;Right;10 reps;Supine Hip ABduction/ADduction: AAROM;Right;10 reps;Supine Straight Leg Raises: AAROM;Right;10 reps;Supine Goniometric ROM: 10-40    General Comments        Pertinent  Vitals/Pain Pain Score: 4  Pain Location: R knee Pain Descriptors / Indicators: Aching Pain Intervention(s): Limited activity within patient's tolerance;Monitored during session;Premedicated before session    Home Living                      Prior Function            PT Goals (current goals can now be found in the care plan section) Progress towards PT goals: Progressing toward goals    Frequency  7X/week    PT Plan Current plan remains appropriate    Co-evaluation             End of Session Equipment Utilized During Treatment: Right knee immobilizer Activity Tolerance: Treatment limited secondary to medical complications (Comment) (nausea and dizziness.) Patient left: in bed;with call bell/phone within reach;with family/visitor present     Time: 1332-1400 PT Time Calculation (min) (ACUTE ONLY): 28 min  Charges:  $Gait Training: 23-37 mins                    G Codes:      Rachel Velez, Rachel Velez Rachel Velez 01/31/2015, 2:08 PM Rachel KelchKaren May Velez PT [phone]

## 2015-01-31 NOTE — Progress Notes (Signed)
Physical Therapy Treatment Patient Details Name: Rachel GitelmanSonja W Velez MRN: [id number] DOB: 1955-10-15 Today's Date: 01/31/2015    History of Present Illness s/p R TKA    PT Comments    Patient c/o increased pain and uncontrollable shaking throughout. RN aware. Will [lan DC to home after next visit.  Follow Up Recommendations  Home health PT;Supervision/Assistance - 24 hour     Equipment Recommendations  Rolling walker with 5" wheels    Recommendations for Other Services       Precautions / Restrictions Precautions Precautions: Knee Required Braces or Orthoses: Knee Immobilizer - Right Restrictions Weight Bearing Restrictions: No    Mobility  Bed Mobility   Bed Mobility: Sit to Supine       Sit to supine: Min assist   General bed mobility comments: support R leg  Transfers   Equipment used: Rolling walker (2 wheeled) Transfers: Sit to/from Stand Sit to Stand: Min guard         General transfer comment: cues for UE/LE placement  Ambulation/Gait Ambulation/Gait assistance: Min guard Ambulation Distance (Feet): 80 Feet Assistive device: Rolling walker (2 wheeled) Gait Pattern/deviations: Step-to pattern;Antalgic     General Gait Details: much more pain today, used aKI   Stairs            Wheelchair Mobility    Modified Rankin (Stroke Patients Only)       Balance                                    Cognition Arousal/Alertness: Awake/alert Behavior During Therapy:  (having uncontrollable shaking)                        Exercises Total Joint Exercises Ankle Circles/Pumps: AROM;Both;10 reps;Supine Heel Slides: AAROM;Right;10 reps;Supine Hip ABduction/ADduction: AAROM;Right;10 reps;Supine Straight Leg Raises: AAROM;Right;10 reps;Supine Goniometric ROM: 10-40    General Comments        Pertinent Vitals/Pain Pain Score: 8  Pain Location: R knee Pain Descriptors / Indicators: Aching;Cramping;Tightness Pain  Intervention(s): Limited activity within patient's tolerance;Monitored during session;Repositioned;Premedicated before session;Ice applied    Home Living                      Prior Function            PT Goals (current goals can now be found in the care plan section) Progress towards PT goals: Not progressing toward goals - comment (more pain)    Frequency  7X/week    PT Plan Current plan remains appropriate    Co-evaluation             End of Session Equipment Utilized During Treatment: Right knee immobilizer Activity Tolerance: Patient limited by pain (and shaking) Patient left: in bed;with call bell/phone within reach     Time: 1033-1055 PT Time Calculation (min) (ACUTE ONLY): 22 min  Charges:  $Gait Training: 8-22 mins                    G Codes:      Rachel Velez 01/31/2015, 11:16 AM

## 2015-05-17 ENCOUNTER — Ambulatory Visit: Payer: Self-pay | Admitting: Orthopedic Surgery

## 2015-05-17 NOTE — Progress Notes (Signed)
Preoperative surgical orders have been place into the [facility] hospital system for Rachel Velez on 05/17/2015, 10:15 AM  by Patrica DuelPERKINS, ALEXZANDREW for surgery on 06-11-2015.  Preop Total Knee orders including Experal, IV Tylenol, and IV Decadron as long as there are no contraindications to the above medications. Avel Peacerew Perkins, PA-C

## 2015-05-30 NOTE — Patient Instructions (Addendum)
YOUR PROCEDURE IS SCHEDULED ON : 06/11/15  REPORT TO [facility] MAIN ENTRANCE FOLLOW SIGNS TO EAST ELEVATOR - GO TO 3rd FLOOR CHECK IN AT 3 EAST NURSES STATION (SHORT STAY) AT:  5:15 AM  CALL THIS NUMBER IF YOU HAVE PROBLEMS THE MORNING OF SURGERY [phone]  REMEMBER:ONLY 1 PER PERSON MAY GO TO SHORT STAY WITH YOU TO GET READY THE MORNING OF YOUR SURGERY  DO NOT EAT FOOD OR DRINK LIQUIDS AFTER MIDNIGHT  TAKE THESE MEDICINES THE MORNING OF SURGERY:None  YOU MAY NOT HAVE ANY METAL ON YOUR BODY INCLUDING HAIR PINS AND PIERCING'S. DO NOT WEAR JEWELRY, MAKEUP, LOTIONS, POWDERS OR PERFUMES. DO NOT WEAR NAIL POLISH. DO NOT SHAVE 48 HRS PRIOR TO SURGERY. MEN MAY SHAVE FACE AND NECK.  DO NOT BRING VALUABLES TO HOSPITAL. [facility] IS NOT RESPONSIBLE FOR VALUABLES.  CONTACTS, DENTURES OR PARTIALS MAY NOT BE WORN TO SURGERY. LEAVE SUITCASE IN CAR. CAN BE BROUGHT TO ROOM AFTER SURGERY.  PATIENTS DISCHARGED THE DAY OF SURGERY WILL NOT BE ALLOWED TO DRIVE HOME.  PLEASE READ OVER THE FOLLOWING INSTRUCTION SHEETS _________________________________________________________________________________                                          [place] - PREPARING FOR SURGERY  Before surgery, you can play an important role.  Because skin is not sterile, your skin needs to be as free of germs as possible.  You can reduce the number of germs on your skin by washing with CHG (chlorahexidine gluconate) soap before surgery.  CHG is an antiseptic cleaner which kills germs and bonds with the skin to continue killing germs even after washing. Please DO NOT use if you have an allergy to CHG or antibacterial soaps.  If your skin becomes reddened/irritated stop using the CHG and inform your nurse when you arrive at Short Stay. Do not shave (including legs and underarms) for at least 48 hours prior to the first CHG shower.  You may shave your face. Please follow these instructions  carefully:   1.  Shower with CHG Soap the night before surgery and the  morning of Surgery.   2.  If you choose to wash your hair, wash your hair first as usual with your  normal  Shampoo.   3.  After you shampoo, rinse your hair and body thoroughly to remove the  shampoo.                                         4.  Use CHG as you would any other liquid soap.  You can apply chg directly  to the skin and wash . Gently wash with scrungie or clean wascloth    5.  Apply the CHG Soap to your body ONLY FROM THE NECK DOWN.   Do not use on open                           Wound or open sores. Avoid contact with eyes, ears mouth and genitals (private parts).                        Genitals (private parts) with your normal soap.  6.  Wash thoroughly, paying special attention to the area where your surgery  will be performed.   7.  Thoroughly rinse your body with warm water from the neck down.   8.  DO NOT shower/wash with your normal soap after using and rinsing off  the CHG Soap .                9.  Pat yourself dry with a clean towel.             10.  Wear clean night clothes to bed after shower             11.  Place clean sheets on your bed the night of your first shower and do not  sleep with pets.  Day of Surgery : Do not apply any lotions/deodorants the morning of surgery.  Please wear clean clothes to the hospital/surgery center.  FAILURE TO FOLLOW THESE INSTRUCTIONS MAY RESULT IN THE CANCELLATION OF YOUR SURGERY    PATIENT SIGNATURE_________________________________  ______________________________________________________________________     Rachel Velez  An incentive spirometer is a tool that can help keep your lungs clear and active. This tool measures how well you are filling your lungs with each breath. Taking long deep breaths may help reverse or decrease the chance of developing breathing (pulmonary) problems (especially infection) following:  A long  period of time when you are unable to move or be active. BEFORE THE PROCEDURE   If the spirometer includes an indicator to show your best effort, your nurse or respiratory therapist will set it to a desired goal.  If possible, sit up straight or lean slightly forward. Try not to slouch.  Hold the incentive spirometer in an upright position. INSTRUCTIONS FOR USE   Sit on the edge of your bed if possible, or sit up as far as you can in bed or on a chair.  Hold the incentive spirometer in an upright position.  Breathe out normally.  Place the mouthpiece in your mouth and seal your lips tightly around it.  Breathe in slowly and as deeply as possible, raising the piston or the ball toward the top of the column.  Hold your breath for 3-5 seconds or for as long as possible. Allow the piston or ball to fall to the bottom of the column.  Remove the mouthpiece from your mouth and breathe out normally.  Rest for a few seconds and repeat Steps 1 through 7 at least 10 times every 1-2 hours when you are awake. Take your time and take a few normal breaths between deep breaths.  The spirometer may include an indicator to show your best effort. Use the indicator as a goal to work toward during each repetition.  After each set of 10 deep breaths, practice coughing to be sure your lungs are clear. If you have an incision (the cut made at the time of surgery), support your incision when coughing by placing a pillow or rolled up towels firmly against it. Once you are able to get out of bed, walk around indoors and cough well. You may stop using the incentive spirometer when instructed by your caregiver.  RISKS AND COMPLICATIONS  Take your time so you do not get dizzy or light-headed.  If you are in pain, you may need to take or ask for pain medication before doing incentive spirometry. It is harder to take a deep breath if you are having pain. AFTER USE  Rest and breathe slowly and easily.  It can  be helpful to keep track of a log of your progress. Your caregiver can provide you with a simple table to help with this. If you are using the spirometer at home, follow these instructions: Rachel Velez IF:   You are having difficultly using the spirometer.  You have trouble using the spirometer as often as instructed.  Your pain medication is not giving enough relief while using the spirometer.  You develop fever of 100.5 F (38.1 C) or higher. SEEK IMMEDIATE MEDICAL CARE IF:   You cough up bloody sputum that had not been present before.  You develop fever of 102 F (38.9 C) or greater.  You develop worsening pain at or near the incision site. MAKE SURE YOU:   Understand these instructions.  Will watch your condition.  Will get help right away if you are not doing well or get worse. Document Released: 11/10/2006 Document Revised: 09/22/2011 Document Reviewed: 01/11/2007 [employer] Patient Information 2014 [employer], [place].   ________________________________________________________________________  WHAT IS A BLOOD TRANSFUSION? Blood Transfusion Information  A transfusion is the replacement of blood or some of its parts. Blood is made up of multiple cells which provide different functions.  Red blood cells carry oxygen and are used for blood loss replacement.  White blood cells fight against infection.  Platelets control bleeding.  Plasma helps clot blood.  Other blood products are available for specialized needs, such as hemophilia or other clotting disorders. BEFORE THE TRANSFUSION  Who gives blood for transfusions?   Healthy volunteers who are fully evaluated to make sure their blood is safe. This is blood bank blood. Transfusion therapy is the safest it has ever been in the practice of medicine. Before blood is taken from a donor, a complete history is taken to make sure that person has no history of diseases nor engages in risky social behavior (examples are  intravenous drug use or sexual activity with multiple partners). The donor's travel history is screened to minimize risk of transmitting infections, such as malaria. The donated blood is tested for signs of infectious diseases, such as HIV and hepatitis. The blood is then tested to be sure it is compatible with you in order to minimize the chance of a transfusion reaction. If you or a relative donates blood, this is often done in anticipation of surgery and is not appropriate for emergency situations. It takes many days to process the donated blood. RISKS AND COMPLICATIONS Although transfusion therapy is very safe and saves many lives, the main dangers of transfusion include:   Getting an infectious disease.  Developing a transfusion reaction. This is an allergic reaction to something in the blood you were given. Every precaution is taken to prevent this. The decision to have a blood transfusion has been considered carefully by your caregiver before blood is given. Blood is not given unless the benefits outweigh the risks. AFTER THE TRANSFUSION  Right after receiving a blood transfusion, you will usually feel much better and more energetic. This is especially true if your red blood cells have gotten low (anemic). The transfusion raises the level of the red blood cells which carry oxygen, and this usually causes an energy increase.  The nurse administering the transfusion will monitor you carefully for complications. HOME CARE INSTRUCTIONS  No special instructions are needed after a transfusion. You may find your energy is better. Speak with your caregiver about any limitations on activity for underlying diseases you may have. SEEK MEDICAL CARE IF:   Your  condition is not improving after your transfusion.  You develop redness or irritation at the intravenous (IV) site. SEEK IMMEDIATE MEDICAL CARE IF:  Any of the following symptoms occur over the next 12 hours:  Shaking chills.  You have a  temperature by mouth above 102 F (38.9 C), not controlled by medicine.  Chest, back, or muscle pain.  People around you feel you are not acting correctly or are confused.  Shortness of breath or difficulty breathing.  Dizziness and fainting.  You get a rash or develop hives.  You have a decrease in urine output.  Your urine turns a dark color or changes to pink, red, or brown. Any of the following symptoms occur over the next 10 days:  You have a temperature by mouth above 102 F (38.9 C), not controlled by medicine.  Shortness of breath.  Weakness after normal activity.  The white part of the eye turns yellow (jaundice).  You have a decrease in the amount of urine or are urinating less often.  Your urine turns a dark color or changes to pink, red, or brown. Document Released: 06/27/2000 Document Revised: 09/22/2011 Document Reviewed: 02/14/2008 [facility] Patient Information 2014 [place], [place].  _______________________________________________________________________

## 2015-06-04 ENCOUNTER — Encounter (HOSPITAL_COMMUNITY)
Admission: RE | Admit: 2015-06-04 | Discharge: 2015-06-04 | Disposition: A | Discharge status: Home / Self Care | Payer: BLUE CROSS/BLUE SHIELD | Source: Ambulatory Visit | Attending: Orthopedic Surgery | Admitting: Orthopedic Surgery

## 2015-06-04 ENCOUNTER — Encounter (HOSPITAL_COMMUNITY): Payer: Self-pay

## 2015-06-04 DIAGNOSIS — Z01812 Encounter for preprocedural laboratory examination: Secondary | ICD-10-CM | POA: Insufficient documentation

## 2015-06-04 HISTORY — DX: Unspecified asthma, uncomplicated: J45.909

## 2015-06-04 LAB — CBC
HCT: 40.6 % (ref 36.0–46.0)
Hemoglobin: 13.4 g/dL (ref 12.0–15.0)
MCH: 30.5 pg (ref 26.0–34.0)
MCHC: 33 g/dL (ref 30.0–36.0)
MCV: 92.5 fL (ref 78.0–100.0)
Platelets: 204 10*3/uL (ref 150–400)
RBC: 4.39 MIL/uL (ref 3.87–5.11)
RDW: 12.5 % (ref 11.5–15.5)
WBC: 5.4 10*3/uL (ref 4.0–10.5)

## 2015-06-04 LAB — COMPREHENSIVE METABOLIC PANEL
ALT: 16 U/L (ref 14–54)
ANION GAP: 7 (ref 5–15)
AST: 21 U/L (ref 15–41)
Albumin: 4.4 g/dL (ref 3.5–5.0)
Alkaline Phosphatase: 48 U/L (ref 38–126)
BUN: 15 mg/dL (ref 6–20)
CHLORIDE: 104 mmol/L (ref 101–111)
CO2: 29 mmol/L (ref 22–32)
CREATININE: 0.68 mg/dL (ref 0.44–1.00)
Calcium: 9.3 mg/dL (ref 8.9–10.3)
GFR calc Af Amer: >60 mL/min (ref >60)
GFR calc non Af Amer: >60 mL/min (ref >60)
Glucose, Bld: 85 mg/dL (ref 65–99)
Potassium: 3.9 mmol/L (ref 3.5–5.1)
SODIUM: 140 mmol/L (ref 135–145)
Total Bilirubin: 0.7 mg/dL (ref 0.3–1.2)
Total Protein: 7.3 g/dL (ref 6.5–8.1)

## 2015-06-04 LAB — PROTIME-INR
INR: 1.02 (ref 0.00–1.49)
Prothrombin Time: 13.6 seconds (ref 11.6–15.2)

## 2015-06-04 LAB — URINALYSIS, ROUTINE W REFLEX MICROSCOPIC
BILIRUBIN URINE: NEGATIVE
Glucose, UA: NEGATIVE mg/dL
HGB URINE DIPSTICK: NEGATIVE
Ketones, ur: NEGATIVE mg/dL
Leukocytes, UA: NEGATIVE
Nitrite: NEGATIVE
PH: 6.5 (ref 5.0–8.0)
Protein, ur: NEGATIVE mg/dL
SPECIFIC GRAVITY, URINE: 1.006 (ref 1.005–1.030)

## 2015-06-04 LAB — SURGICAL PCR SCREEN
MRSA, PCR: NEGATIVE
STAPHYLOCOCCUS AUREUS: NEGATIVE

## 2015-06-04 LAB — APTT: APTT: 30 s (ref 24–37)

## 2015-06-04 NOTE — Progress Notes (Signed)
01-29-15 - R. Knee replacement surgery - EPIC

## 2015-06-08 NOTE — H&P (Signed)
TOTAL KNEE ADMISSION H&P  Patient is being admitted for left total knee arthroplasty.  Subjective:  Chief Complaint:left knee pain.  HPI: Rachel Velez, 59 y.o. female, has a history of pain and functional disability in the left knee due to arthritis and has failed non-surgical conservative treatments for greater than 12 weeks to includeNSAID's and/or analgesics, corticosteriod injections and activity modification.  Onset of symptoms was gradual, starting 6 years ago with gradually worsening course since that time. The patient noted no past surgery on the left knee(s).  Patient currently rates pain in the left knee(s) at 8 out of 10 with activity. Patient has night pain, worsening of pain with activity and weight bearing, pain that interferes with activities of daily living, pain with passive range of motion, crepitus and joint swelling.  Patient has evidence of periarticular osteophytes and joint space narrowing by imaging studies. There is no active infection.  Patient Active Problem List   Diagnosis Date Noted  . OA (osteoarthritis) of knee 01/29/2015   Past Medical History  Diagnosis Date  . Complication of anesthesia   . PONV (postoperative nausea and vomiting)   . Environmental allergies   . Arthritis   . Vertigo   . Asthma     Past Surgical History  Procedure Laterality Date  . Cervical cerclage      x2  . Cesarean section      x2  . Abdominal hysterectomy    . Colonoscopy    . Knee arthroscopy      rt  . Total knee arthroplasty Right 01/29/2015    Procedure: RIGHT TOTAL KNEE ARTHROPLASTY;  Surgeon: Ollen GrossFrank Aluisio, MD;  Location: [facility] ORS;  Service: Orthopedics;  Laterality: Right;     Current outpatient prescriptions:  .  calcium carbonate (OSCAL) 1500 (600 CA) MG TABS tablet, Take 600 mg of elemental calcium by mouth daily., Disp: , Rfl:  .  cholecalciferol (VITAMIN D) 1000 UNITS tablet, Take 1,000 Units by mouth daily., Disp: , Rfl:  .  estradiol (MINIVELLE) 0.05  MG/24HR patch, Place 1 patch onto the skin 2 (two) times a week. Changes Mon, Thurs, Disp: , Rfl:  .  glucosamine-chondroitin 500-400 MG tablet, Take 1 tablet by mouth daily., Disp: , Rfl:  .  naproxen sodium (ANAPROX) 220 MG tablet, Take 220 mg by mouth 2 (two) times daily as needed., Disp: , Rfl:  .  Omega 3 1200 MG CAPS, Take 1 capsule by mouth daily., Disp: , Rfl:   Allergies  Allergen Reactions  . Biaxin [Clarithromycin] Other (See Comments)    Altered mental status  . Penicillins Hives    Has patient had a PCN reaction causing immediate rash, facial/tongue/throat swelling, SOB or lightheadedness with hypotension: No Has patient had a PCN reaction causing severe rash involving mucus membranes or skin necrosis: No Has patient had a PCN reaction that required hospitalization No Has patient had a PCN reaction occurring within the last 10 years: No If all of the above answers are "NO", then may proceed with Cephalosporin use.   . Sulfa Antibiotics Hives    Social History  Substance Use Topics  . Smoking status: Never Smoker   . Smokeless tobacco: Never Used  . Alcohol Use: Yes     Comment: occasional      Review of Systems  Constitutional: Negative.   HENT: Negative.   Eyes: Negative.   Respiratory: Negative.   Cardiovascular: Negative.   Gastrointestinal: Negative.   Genitourinary: Positive for frequency. Negative for dysuria, urgency,  hematuria and flank pain.  Musculoskeletal: Positive for joint pain. Negative for back pain, falls and neck pain.       Left knee pain  Skin: Negative.   Neurological: Negative.   Endo/Heme/Allergies: Positive for environmental allergies. Negative for polydipsia. Does not bruise/bleed easily.  Psychiatric/Behavioral: Negative.     Objective:  Physical Exam  Constitutional: She is oriented to person, place, and time. She appears well-developed and well-nourished. No distress.  HENT:  Head: Normocephalic and atraumatic.  Right Ear:  External ear normal.  Left Ear: External ear normal.  Nose: Nose normal.  Mouth/Throat: Oropharynx is clear and moist.  Eyes: Conjunctivae and EOM are normal.  Neck: Normal range of motion. Neck supple.  Cardiovascular: Normal rate, regular rhythm, normal heart sounds and intact distal pulses.   No murmur heard. Respiratory: Effort normal and breath sounds normal.  GI: Soft. Bowel sounds are normal. She exhibits no distension. There is no tenderness.  Musculoskeletal:       Right hip: Normal.       Left hip: Normal.       Right knee: Normal.       Left knee: She exhibits swelling. She exhibits no effusion. Tenderness found. Lateral joint line tenderness noted.  Her left knee shows full extension, further flexion to 125-degrees with crepitation at the patellofemoral joint.    Neurological: She is alert and oriented to person, place, and time. She has normal strength and normal reflexes. No sensory deficit.  Skin: She is not diaphoretic.  Psychiatric: She has a normal mood and affect. Her behavior is normal.   Vitals  Weight: 150 lb Height: 63in Body Surface Area: 1.71 m Body Mass Index: 26.57 kg/m  Pulse: 66 (Regular)  BP: 138/84 (Sitting, Left Arm, Standard)  Imaging Review Plain radiographs demonstrate severe degenerative joint disease of the left knee(s). The overall alignment ismild varus. The bone quality appears to be good for age and reported activity level.  Assessment/Plan:  End stage primary osteoarthritis, left knee   The patient history, physical examination, clinical judgment of the provider and imaging studies are consistent with end stage degenerative joint disease of the left knee(s) and total knee arthroplasty is deemed medically necessary. The treatment options including medical management, injection therapy arthroscopy and arthroplasty were discussed at length. The risks and benefits of total knee arthroplasty were presented and reviewed. The risks due  to aseptic loosening, infection, stiffness, patella tracking problems, thromboembolic complications and other imponderables were discussed. The patient acknowledged the explanation, agreed to proceed with the plan and consent was signed. Patient is being admitted for inpatient treatment for surgery, pain control, PT, OT, prophylactic antibiotics, VTE prophylaxis, progressive ambulation and ADL's and discharge planning. The patient is planning to be discharged home with home health services    Has rash from either Xarelto or Robaxin after Right TKA Issues with pruritus with anesthesia     Dimitri Ped, PA-C

## 2015-06-11 ENCOUNTER — Inpatient Hospital Stay (HOSPITAL_COMMUNITY): Payer: BLUE CROSS/BLUE SHIELD | Admitting: Anesthesiology

## 2015-06-11 ENCOUNTER — Inpatient Hospital Stay (HOSPITAL_COMMUNITY)
Admission: RE | Admit: 2015-06-11 | Discharge: 2015-06-13 | DRG: 470 | Disposition: A | Discharge status: Home Health | Payer: BLUE CROSS/BLUE SHIELD | Source: Ambulatory Visit | Attending: Orthopedic Surgery | Admitting: Orthopedic Surgery

## 2015-06-11 ENCOUNTER — Encounter (HOSPITAL_COMMUNITY)
Admission: RE | Disposition: A | Discharge status: Home Health | Payer: Self-pay | Source: Ambulatory Visit | Attending: Orthopedic Surgery

## 2015-06-11 ENCOUNTER — Encounter (HOSPITAL_COMMUNITY): Payer: Self-pay | Admitting: Anesthesiology

## 2015-06-11 DIAGNOSIS — Z880 Allergy status to penicillin: Secondary | ICD-10-CM

## 2015-06-11 DIAGNOSIS — M25762 Osteophyte, left knee: Secondary | ICD-10-CM | POA: Diagnosis present

## 2015-06-11 DIAGNOSIS — Z881 Allergy status to other antibiotic agents status: Secondary | ICD-10-CM | POA: Diagnosis not present

## 2015-06-11 DIAGNOSIS — Z79899 Other long term (current) drug therapy: Secondary | ICD-10-CM

## 2015-06-11 DIAGNOSIS — J45909 Unspecified asthma, uncomplicated: Secondary | ICD-10-CM | POA: Diagnosis present

## 2015-06-11 DIAGNOSIS — M1712 Unilateral primary osteoarthritis, left knee: Secondary | ICD-10-CM | POA: Diagnosis present

## 2015-06-11 DIAGNOSIS — M1710 Unilateral primary osteoarthritis, unspecified knee: Secondary | ICD-10-CM | POA: Diagnosis present

## 2015-06-11 DIAGNOSIS — Z01812 Encounter for preprocedural laboratory examination: Secondary | ICD-10-CM | POA: Diagnosis not present

## 2015-06-11 DIAGNOSIS — M179 Osteoarthritis of knee, unspecified: Secondary | ICD-10-CM | POA: Diagnosis present

## 2015-06-11 DIAGNOSIS — M25562 Pain in left knee: Secondary | ICD-10-CM | POA: Diagnosis present

## 2015-06-11 HISTORY — PX: TOTAL KNEE ARTHROPLASTY: SHX125

## 2015-06-11 LAB — TYPE AND SCREEN
ABO/RH(D): O POS
ANTIBODY SCREEN: NEGATIVE

## 2015-06-11 SURGERY — ARTHROPLASTY, KNEE, TOTAL
Anesthesia: Spinal | Site: Knee | Laterality: Left

## 2015-06-11 MED ORDER — KETOROLAC TROMETHAMINE 15 MG/ML IJ SOLN
7.5000 mg | Freq: Four times a day (QID) | INTRAMUSCULAR | Status: AC | PRN
  Administered 2015-06-12: 7.5 mg via INTRAVENOUS
  Filled 2015-06-11: qty 1

## 2015-06-11 MED ORDER — POLYETHYLENE GLYCOL 3350 17 G PO PACK: 17.0000 g | PACK | Freq: Every day | ORAL | Status: DC | PRN

## 2015-06-11 MED ORDER — PHENYLEPHRINE 40 MCG/ML (10ML) SYRINGE FOR IV PUSH (FOR BLOOD PRESSURE SUPPORT)
PREFILLED_SYRINGE | INTRAVENOUS | Status: AC
  Filled 2015-06-11: qty 10

## 2015-06-11 MED ORDER — BUPIVACAINE HCL 0.25 % IJ SOLN
INTRAMUSCULAR | Status: DC | PRN
  Administered 2015-06-11: 20 mL via INTRA_ARTICULAR

## 2015-06-11 MED ORDER — ONDANSETRON HCL 4 MG/2ML IJ SOLN
4.0000 mg | Freq: Four times a day (QID) | INTRAMUSCULAR | Status: DC | PRN
  Administered 2015-06-12: 4 mg via INTRAVENOUS
  Filled 2015-06-11 (×2): qty 2

## 2015-06-11 MED ORDER — ACETAMINOPHEN 650 MG RE SUPP: 650.0000 mg | Freq: Four times a day (QID) | RECTAL | Status: DC | PRN

## 2015-06-11 MED ORDER — FENTANYL CITRATE (PF) 100 MCG/2ML IJ SOLN
INTRAMUSCULAR | Status: AC
  Filled 2015-06-11: qty 2

## 2015-06-11 MED ORDER — OXYCODONE HCL 5 MG PO TABS
5.0000 mg | ORAL_TABLET | ORAL | Status: DC | PRN
  Administered 2015-06-11: 5 mg via ORAL
  Administered 2015-06-11 – 2015-06-12 (×3): 10 mg via ORAL
  Administered 2015-06-12: 5 mg via ORAL
  Administered 2015-06-12 (×2): 10 mg via ORAL
  Administered 2015-06-13: 5 mg via ORAL
  Filled 2015-06-11 (×3): qty 2
  Filled 2015-06-11: qty 1
  Filled 2015-06-11: qty 2
  Filled 2015-06-11 (×2): qty 1
  Filled 2015-06-11: qty 2

## 2015-06-11 MED ORDER — PROPOFOL 10 MG/ML IV BOLUS
INTRAVENOUS | Status: AC
  Filled 2015-06-11: qty 60

## 2015-06-11 MED ORDER — FENTANYL CITRATE (PF) 100 MCG/2ML IJ SOLN
INTRAMUSCULAR | Status: DC | PRN
  Administered 2015-06-11 (×2): 25 ug via INTRAVENOUS
  Administered 2015-06-11: 50 ug via INTRAVENOUS

## 2015-06-11 MED ORDER — MIDAZOLAM HCL 2 MG/2ML IJ SOLN
INTRAMUSCULAR | Status: AC
  Filled 2015-06-11: qty 2

## 2015-06-11 MED ORDER — GLYCOPYRROLATE 0.2 MG/ML IJ SOLN
INTRAMUSCULAR | Status: AC
  Filled 2015-06-11: qty 1

## 2015-06-11 MED ORDER — MORPHINE SULFATE (PF) 2 MG/ML IV SOLN
1.0000 mg | INTRAVENOUS | Status: DC | PRN
Start: 2015-06-11 — End: 2015-06-13

## 2015-06-11 MED ORDER — TRAMADOL HCL 50 MG PO TABS
50.0000 mg | ORAL_TABLET | Freq: Four times a day (QID) | ORAL | Status: DC | PRN
  Administered 2015-06-12 – 2015-06-13 (×3): 100 mg via ORAL
  Filled 2015-06-11 (×3): qty 2

## 2015-06-11 MED ORDER — SODIUM CHLORIDE 0.9 % IJ SOLN
INTRAMUSCULAR | Status: DC | PRN
  Administered 2015-06-11: 30 mL

## 2015-06-11 MED ORDER — PROPOFOL 500 MG/50ML IV EMUL
INTRAVENOUS | Status: DC | PRN
  Administered 2015-06-11: 75 ug/kg/min via INTRAVENOUS

## 2015-06-11 MED ORDER — ONDANSETRON HCL 4 MG/2ML IJ SOLN
INTRAMUSCULAR | Status: DC | PRN
  Administered 2015-06-11: 4 mg via INTRAVENOUS

## 2015-06-11 MED ORDER — MIDAZOLAM HCL 5 MG/5ML IJ SOLN
INTRAMUSCULAR | Status: DC | PRN
  Administered 2015-06-11: 2 mg via INTRAVENOUS

## 2015-06-11 MED ORDER — LACTATED RINGERS IV SOLN
INTRAVENOUS | Status: DC | PRN
  Administered 2015-06-11 (×2): via INTRAVENOUS
  Administered 2015-06-11: 1000 mL

## 2015-06-11 MED ORDER — DEXAMETHASONE SODIUM PHOSPHATE 10 MG/ML IJ SOLN
10.0000 mg | Freq: Once | INTRAMUSCULAR | Status: AC
  Administered 2015-06-11: 10 mg via INTRAVENOUS

## 2015-06-11 MED ORDER — METHOCARBAMOL 1000 MG/10ML IJ SOLN
500.0000 mg | Freq: Four times a day (QID) | INTRAVENOUS | Status: DC | PRN
  Administered 2015-06-11: 500 mg via INTRAVENOUS
  Filled 2015-06-11 (×3): qty 5

## 2015-06-11 MED ORDER — PHENYLEPHRINE HCL 10 MG/ML IJ SOLN
INTRAMUSCULAR | Status: DC | PRN
  Administered 2015-06-11 (×8): 80 ug via INTRAVENOUS

## 2015-06-11 MED ORDER — BISACODYL 10 MG RE SUPP: 10.0000 mg | Freq: Every day | RECTAL | Status: DC | PRN

## 2015-06-11 MED ORDER — FLEET ENEMA 7-19 GM/118ML RE ENEM: 1.0000 | ENEMA | Freq: Once | RECTAL | Status: DC | PRN

## 2015-06-11 MED ORDER — METOCLOPRAMIDE HCL 5 MG/ML IJ SOLN
5.0000 mg | Freq: Three times a day (TID) | INTRAMUSCULAR | Status: DC | PRN
  Administered 2015-06-12: 10 mg via INTRAVENOUS
  Filled 2015-06-11: qty 2

## 2015-06-11 MED ORDER — ONDANSETRON HCL 4 MG PO TABS
4.0000 mg | ORAL_TABLET | Freq: Four times a day (QID) | ORAL | Status: DC | PRN
  Administered 2015-06-12 – 2015-06-13 (×2): 4 mg via ORAL
  Filled 2015-06-11 (×2): qty 1

## 2015-06-11 MED ORDER — ACETAMINOPHEN 10 MG/ML IV SOLN
1000.0000 mg | Freq: Once | INTRAVENOUS | Status: AC
  Administered 2015-06-11: 1000 mg via INTRAVENOUS

## 2015-06-11 MED ORDER — CHLORHEXIDINE GLUCONATE 4 % EX LIQD: 60.0000 mL | Freq: Once | CUTANEOUS | Status: DC

## 2015-06-11 MED ORDER — PHENOL 1.4 % MT LIQD: 1.0000 | OROMUCOSAL | Status: DC | PRN

## 2015-06-11 MED ORDER — PROMETHAZINE HCL 25 MG/ML IJ SOLN: 6.2500 mg | INTRAMUSCULAR | Status: DC | PRN

## 2015-06-11 MED ORDER — EPHEDRINE SULFATE 50 MG/ML IJ SOLN
INTRAMUSCULAR | Status: AC
  Filled 2015-06-11: qty 4

## 2015-06-11 MED ORDER — BUPIVACAINE HCL (PF) 0.25 % IJ SOLN
INTRAMUSCULAR | Status: AC
  Filled 2015-06-11: qty 30

## 2015-06-11 MED ORDER — HYDROMORPHONE HCL 1 MG/ML IJ SOLN: 0.2500 mg | INTRAMUSCULAR | Status: DC | PRN

## 2015-06-11 MED ORDER — RIVAROXABAN 10 MG PO TABS
10.0000 mg | ORAL_TABLET | Freq: Every day | ORAL | Status: DC
  Administered 2015-06-12 – 2015-06-13 (×2): 10 mg via ORAL
  Filled 2015-06-11 (×3): qty 1

## 2015-06-11 MED ORDER — SODIUM CHLORIDE 0.9 % IV SOLN: INTRAVENOUS | Status: DC

## 2015-06-11 MED ORDER — DEXAMETHASONE SODIUM PHOSPHATE 10 MG/ML IJ SOLN
10.0000 mg | Freq: Once | INTRAMUSCULAR | Status: AC
  Administered 2015-06-12: 10 mg via INTRAVENOUS
  Filled 2015-06-11: qty 1

## 2015-06-11 MED ORDER — ONDANSETRON HCL 4 MG/2ML IJ SOLN
INTRAMUSCULAR | Status: AC
  Filled 2015-06-11: qty 2

## 2015-06-11 MED ORDER — BUPIVACAINE LIPOSOME 1.3 % IJ SUSP
INTRAMUSCULAR | Status: DC | PRN
  Administered 2015-06-11: 20 mL

## 2015-06-11 MED ORDER — VANCOMYCIN HCL IN DEXTROSE 1-5 GM/200ML-% IV SOLN
1000.0000 mg | Freq: Two times a day (BID) | INTRAVENOUS | Status: AC
  Administered 2015-06-11: 1000 mg via INTRAVENOUS
  Filled 2015-06-11: qty 200

## 2015-06-11 MED ORDER — SODIUM CHLORIDE 0.9 % IJ SOLN
INTRAMUSCULAR | Status: AC
  Filled 2015-06-11: qty 10

## 2015-06-11 MED ORDER — ACETAMINOPHEN 500 MG PO TABS
1000.0000 mg | ORAL_TABLET | Freq: Four times a day (QID) | ORAL | Status: AC
  Administered 2015-06-11 – 2015-06-12 (×3): 1000 mg via ORAL
  Filled 2015-06-11 (×4): qty 2

## 2015-06-11 MED ORDER — METOCLOPRAMIDE HCL 10 MG PO TABS: 5.0000 mg | ORAL_TABLET | Freq: Three times a day (TID) | ORAL | Status: DC | PRN

## 2015-06-11 MED ORDER — LIDOCAINE HCL (CARDIAC) 20 MG/ML IV SOLN
INTRAVENOUS | Status: AC
  Filled 2015-06-11: qty 5

## 2015-06-11 MED ORDER — ACETAMINOPHEN 325 MG PO TABS: 650.0000 mg | ORAL_TABLET | Freq: Four times a day (QID) | ORAL | Status: DC | PRN

## 2015-06-11 MED ORDER — DEXAMETHASONE SODIUM PHOSPHATE 10 MG/ML IJ SOLN
INTRAMUSCULAR | Status: AC
  Filled 2015-06-11: qty 1

## 2015-06-11 MED ORDER — TRANEXAMIC ACID 1000 MG/10ML IV SOLN
1000.0000 mg | INTRAVENOUS | Status: AC
  Administered 2015-06-11: 1000 mg via INTRAVENOUS
  Filled 2015-06-11: qty 10

## 2015-06-11 MED ORDER — KCL IN DEXTROSE-NACL 20-5-0.45 MEQ/L-%-% IV SOLN
INTRAVENOUS | Status: DC
  Administered 2015-06-11 – 2015-06-12 (×2): via INTRAVENOUS
  Filled 2015-06-11 (×4): qty 1000

## 2015-06-11 MED ORDER — DOCUSATE SODIUM 100 MG PO CAPS
100.0000 mg | ORAL_CAPSULE | Freq: Two times a day (BID) | ORAL | Status: DC
  Administered 2015-06-11 – 2015-06-13 (×4): 100 mg via ORAL

## 2015-06-11 MED ORDER — VANCOMYCIN HCL IN DEXTROSE 1-5 GM/200ML-% IV SOLN
1000.0000 mg | INTRAVENOUS | Status: AC
  Administered 2015-06-11: 1000 mg via INTRAVENOUS
  Filled 2015-06-11: qty 200

## 2015-06-11 MED ORDER — SODIUM CHLORIDE 0.9 % IJ SOLN
INTRAMUSCULAR | Status: AC
  Filled 2015-06-11: qty 50

## 2015-06-11 MED ORDER — DIPHENHYDRAMINE HCL 12.5 MG/5ML PO ELIX: 12.5000 mg | ORAL_SOLUTION | ORAL | Status: DC | PRN

## 2015-06-11 MED ORDER — METHOCARBAMOL 500 MG PO TABS
500.0000 mg | ORAL_TABLET | Freq: Four times a day (QID) | ORAL | Status: DC | PRN
  Administered 2015-06-12 (×2): 500 mg via ORAL
  Filled 2015-06-11 (×2): qty 1

## 2015-06-11 MED ORDER — MENTHOL 3 MG MT LOZG: 1.0000 | LOZENGE | OROMUCOSAL | Status: DC | PRN

## 2015-06-11 MED ORDER — BUPIVACAINE LIPOSOME 1.3 % IJ SUSP
20.0000 mL | Freq: Once | INTRAMUSCULAR | Status: DC
  Filled 2015-06-11: qty 20

## 2015-06-11 MED ORDER — BUPIVACAINE IN DEXTROSE 0.75-8.25 % IT SOLN
INTRATHECAL | Status: DC | PRN
  Administered 2015-06-11: 1.9 mL via INTRATHECAL

## 2015-06-11 MED ORDER — ACETAMINOPHEN 10 MG/ML IV SOLN
INTRAVENOUS | Status: AC
  Filled 2015-06-11: qty 100

## 2015-06-11 SURGICAL SUPPLY — 50 items
BAG DECANTER FOR FLEXI CONT (MISCELLANEOUS) IMPLANT
BAG ZIPLOCK 12X15 (MISCELLANEOUS) ×3 IMPLANT
BANDAGE ELASTIC 6 VELCRO ST LF (GAUZE/BANDAGES/DRESSINGS) ×3 IMPLANT
BLADE SAG 18X100X1.27 (BLADE) ×3 IMPLANT
BLADE SAW SGTL 11.0X1.19X90.0M (BLADE) ×3 IMPLANT
BOWL SMART MIX CTS (DISPOSABLE) ×3 IMPLANT
CAPT KNEE TOTAL 3 ATTUNE ×3 IMPLANT
CEMENT HV SMART SET (Cement) ×6 IMPLANT
CLOSURE WOUND 1/2 X4 (GAUZE/BANDAGES/DRESSINGS) ×1
CLOTH BEACON ORANGE TIMEOUT ST (SAFETY) ×3 IMPLANT
CUFF TOURN SGL QUICK 34 (TOURNIQUET CUFF) ×2
CUFF TRNQT CYL 34X4X40X1 (TOURNIQUET CUFF) ×1 IMPLANT
DECANTER SPIKE VIAL GLASS SM (MISCELLANEOUS) ×3 IMPLANT
DRAPE U-SHAPE 47X51 STRL (DRAPES) ×3 IMPLANT
DRSG ADAPTIC 3X8 NADH LF (GAUZE/BANDAGES/DRESSINGS) ×3 IMPLANT
DRSG PAD ABDOMINAL 8X10 ST (GAUZE/BANDAGES/DRESSINGS) ×3 IMPLANT
DURAPREP 26ML APPLICATOR (WOUND CARE) ×3 IMPLANT
ELECT REM PT RETURN 9FT ADLT (ELECTROSURGICAL) ×3
ELECTRODE REM PT RTRN 9FT ADLT (ELECTROSURGICAL) ×1 IMPLANT
EVACUATOR 1/8 PVC DRAIN (DRAIN) ×3 IMPLANT
GAUZE SPONGE 4X4 12PLY STRL (GAUZE/BANDAGES/DRESSINGS) ×3 IMPLANT
GLOVE BIO SURGEON STRL SZ7.5 (GLOVE) ×3 IMPLANT
GLOVE BIO SURGEON STRL SZ8 (GLOVE) ×3 IMPLANT
GLOVE BIOGEL PI IND STRL 6.5 (GLOVE) IMPLANT
GLOVE BIOGEL PI IND STRL 8 (GLOVE) ×2 IMPLANT
GLOVE BIOGEL PI INDICATOR 6.5 (GLOVE)
GLOVE BIOGEL PI INDICATOR 8 (GLOVE) ×4
GLOVE SURG SS PI 6.5 STRL IVOR (GLOVE) IMPLANT
GOWN STRL REUS W/TWL LRG LVL3 (GOWN DISPOSABLE) ×3 IMPLANT
GOWN STRL REUS W/TWL XL LVL3 (GOWN DISPOSABLE) ×3 IMPLANT
HANDPIECE INTERPULSE COAX TIP (DISPOSABLE) ×2
IMMOBILIZER KNEE 20 (SOFTGOODS) ×3
IMMOBILIZER KNEE 20 THIGH 36 (SOFTGOODS) ×1 IMPLANT
MANIFOLD NEPTUNE II (INSTRUMENTS) ×3 IMPLANT
NS IRRIG 1000ML POUR BTL (IV SOLUTION) ×3 IMPLANT
PACK TOTAL KNEE CUSTOM (KITS) ×3 IMPLANT
PADDING CAST COTTON 6X4 STRL (CAST SUPPLIES) ×3 IMPLANT
POSITIONER SURGICAL ARM (MISCELLANEOUS) ×3 IMPLANT
SET HNDPC FAN SPRY TIP SCT (DISPOSABLE) ×1 IMPLANT
STRIP CLOSURE SKIN 1/2X4 (GAUZE/BANDAGES/DRESSINGS) ×2 IMPLANT
SUT MNCRL AB 4-0 PS2 18 (SUTURE) ×3 IMPLANT
SUT VIC AB 2-0 CT1 27 (SUTURE) ×6
SUT VIC AB 2-0 CT1 TAPERPNT 27 (SUTURE) ×3 IMPLANT
SUT VLOC 180 0 24IN GS25 (SUTURE) ×3 IMPLANT
SYR 50ML LL SCALE MARK (SYRINGE) IMPLANT
TRAY FOLEY W/METER SILVER 14FR (SET/KITS/TRAYS/PACK) ×3 IMPLANT
TRAY FOLEY W/METER SILVER 16FR (SET/KITS/TRAYS/PACK) IMPLANT
WATER STERILE IRR 1500ML POUR (IV SOLUTION) ×3 IMPLANT
WRAP KNEE MAXI GEL POST OP (GAUZE/BANDAGES/DRESSINGS) ×3 IMPLANT
YANKAUER SUCT BULB TIP 10FT TU (MISCELLANEOUS) ×3 IMPLANT

## 2015-06-11 NOTE — Anesthesia Procedure Notes (Signed)
Spinal Patient location during procedure: OR Start time: 06/11/2015 8:10 AM End time: 06/11/2015 8:15 AM Staffing Resident/CRNA: Artemis Loyal Performed by: resident/CRNA  Preanesthetic Checklist Completed: patient identified, site marked, surgical consent, pre-op evaluation, timeout performed, IV checked, risks and benefits discussed and monitors and equipment checked Spinal Block Patient position: sitting Prep: Betadine Patient monitoring: heart rate, cardiac monitor, continuous pulse ox and blood pressure Approach: midline Location: L3-4 Injection technique: single-shot Needle Needle type: Sprotte  Needle gauge: 24 G Needle length: 9 cm Needle insertion depth: 7 cm Assessment Sensory level: T6 Additional Notes -heme, -para, VSS.  Exp  2016-11-10 Lot  [id number]
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
## 2015-06-11 NOTE — Interval H&P Note (Signed)
History and Physical Interval Note:  06/11/2015 7:00 AM  Rachel Velez  has presented today for surgery, with the diagnosis of OA LEFT KNE   The various methods of treatment have been discussed with the patient and family. After consideration of risks, benefits and other options for treatment, the patient has consented to  Procedure(s): TOTAL LEFT KNEE ARTHROPLASTY (Left) as a surgical intervention .  The patient's history has been reviewed, patient examined, no change in status, stable for surgery.  I have reviewed the patient's chart and labs.  Questions were answered to the patient's satisfaction.     Loanne DrillingALUISIO,Sahian Kerney V

## 2015-06-11 NOTE — Anesthesia Preprocedure Evaluation (Addendum)
Anesthesia Evaluation  Patient identified by MRN, date of birth, ID band Patient awake    Reviewed: Allergy & Precautions, NPO status , Patient's Chart, lab work & pertinent test results  History of Anesthesia Complications (+) PONV and history of anesthetic complications  Airway Mallampati: II  TM Distance: >3 FB Neck ROM: Full    Dental no notable dental hx.    Pulmonary asthma ,    Pulmonary exam normal breath sounds clear to auscultation       Cardiovascular negative cardio ROS Normal cardiovascular exam Rhythm:Regular Rate:Normal     Neuro/Psych negative neurological ROS  negative psych ROS   GI/Hepatic negative GI ROS, Neg liver ROS,   Endo/Other  negative endocrine ROS  Renal/GU negative Renal ROS  negative genitourinary   Musculoskeletal  (+) Arthritis ,   Abdominal   Peds negative pediatric ROS (+)  Hematology negative hematology ROS (+)   Anesthesia Other Findings   Reproductive/Obstetrics negative OB ROS                             Anesthesia Physical Anesthesia Plan  ASA: II  Anesthesia Plan: Spinal   Post-op Pain Management:    Induction: Intravenous  Airway Management Planned: Natural Airway  Additional Equipment:   Intra-op Plan:   Post-operative Plan: Extubation in OR  Informed Consent: I have reviewed the patients History and Physical, chart, labs and discussed the procedure including the risks, benefits and alternatives for the proposed anesthesia with the patient or authorized representative who has indicated his/her understanding and acceptance.   Dental advisory given  Plan Discussed with: CRNA  Anesthesia Plan Comments: (Discussed risks and benefits of and differences between spinal and general. Discussed risks of spinal including headache, backache, failure, bleeding and hematoma, infection, and nerve damage. Patient consents to spinal. Questions  answered. Coagulation studies and platelet count acceptable.)       Anesthesia Quick Evaluation

## 2015-06-11 NOTE — Op Note (Signed)
Pre-operative diagnosis- Osteoarthritis  Left knee(s)  Post-operative diagnosis- Osteoarthritis Left knee(s)  Procedure-  Left  Total Knee Arthroplasty  Surgeon- Gus RankinFrank V. Erilyn Pearman, MD  Assistant- Avel Peacerew Perkins, PA-C   Anesthesia-  Spinal  EBL-* No blood loss amount entered *   Drains Hemovac  Tourniquet time-  Total Tourniquet Time Documented: Thigh (Left) - 28 minutes Total: Thigh (Left) - 28 minutes     Complications- None  Condition-PACU - hemodynamically stable.   Brief Clinical Note  Rachel Velez is a 59 y.o. year old female with end stage OA of her left knee with progressively worsening pain and dysfunction. She has constant pain, with activity and at rest and significant functional deficits with difficulties even with ADLs. She has had extensive non-op management including analgesics, injections of cortisone and viscosupplements, and home exercise program, but remains in significant pain with significant dysfunction. Radiographs show bone on bone arthritis medial and patellofemoral. She presents now for left Total Knee Arthroplasty.     Procedure in detail---   The patient is brought into the operating room and positioned supine on the operating table. After successful administration of  Spinal,   a tourniquet is placed high on the  Left thigh(s) and the lower extremity is prepped and draped in the usual sterile fashion. Time out is performed by the operating team and then the  Left lower extremity is wrapped in Esmarch, knee flexed and the tourniquet inflated to 300 mmHg.       A midline incision is made with a ten blade through the subcutaneous tissue to the level of the extensor mechanism. A fresh blade is used to make a medial parapatellar arthrotomy. Soft tissue over the proximal medial tibia is subperiosteally elevated to the joint line with a knife and into the semimembranosus bursa with a Cobb elevator. Soft tissue over the proximal lateral tibia is elevated with  attention being paid to avoiding the patellar tendon on the tibial tubercle. The patella is everted, knee flexed 90 degrees and the ACL and PCL are removed. Findings are bone on bone medial and patellofemoral with large global osteophytes.        The drill is used to create a starting hole in the distal femur and the canal is thoroughly irrigated with sterile saline to remove the fatty contents. The 5 degree Left  valgus alignment guide is placed into the femoral canal and the distal femoral cutting block is pinned to remove 9 mm off the distal femur. Resection is made with an oscillating saw.      The tibia is subluxed forward and the menisci are removed. The extramedullary alignment guide is placed referencing proximally at the medial aspect of the tibial tubercle and distally along the second metatarsal axis and tibial crest. The block is pinned to remove 2mm off the more deficient medial  side. Resection is made with an oscillating saw. Size 5is the most appropriate size for the tibia and the proximal tibia is prepared with the modular drill and keel punch for that size.      The femoral sizing guide is placed and size 5 is most appropriate. Rotation is marked off the epicondylar axis and confirmed by creating a rectangular flexion gap at 90 degrees. The size 5 cutting block is pinned in this rotation and the anterior, posterior and chamfer cuts are made with the oscillating saw. The intercondylar block is then placed and that cut is made.      Trial size 5 tibial  component, trial size 5 posterior stabilized femur and a 6  mm posterior stabilized rotating platform insert trial is placed. Full extension is achieved with excellent varus/valgus and anterior/posterior balance throughout full range of motion. The patella is everted and thickness measured to be 22  mm. Free hand resection is taken to 12 mm, a 38 template is placed, lug holes are drilled, trial patella is placed, and it tracks normally. Osteophytes  are removed off the posterior femur with the trial in place. All trials are removed and the cut bone surfaces prepared with pulsatile lavage. Cement is mixed and once ready for implantation, the size 5 tibial implant, size  5 narrow posterior stabilized femoral component, and the size 38 patella are cemented in place and the patella is held with the clamp. The trial insert is placed and the knee held in full extension. The Exparel (20 ml mixed with 30 ml saline) and .25% Bupivicaine, are injected into the extensor mechanism, posterior capsule, medial and lateral gutters and subcutaneous tissues.  All extruded cement is removed and once the cement is hard the permanent 6 mm posterior stabilized rotating platform insert is placed into the tibial tray.      The wound is copiously irrigated with saline solution and the extensor mechanism closed over a hemovac drain with #1 V-loc suture. The tourniquet is released for a total tourniquet time of 28  minutes. Flexion against gravity is 140 degrees and the patella tracks normally. Subcutaneous tissue is closed with 2.0 vicryl and subcuticular with running 4.0 Monocryl. The incision is cleaned and dried and steri-strips and a bulky sterile dressing are applied. The limb is placed into a knee immobilizer and the patient is awakened and transported to recovery in stable condition.      Please note that a surgical assistant was a medical necessity for this procedure in order to perform it in a safe and expeditious manner. Surgical assistant was necessary to retract the ligaments and vital neurovascular structures to prevent injury to them and also necessary for proper positioning of the limb to allow for anatomic placement of the prosthesis.   Gus Rankin Ahijah Devery, MD    06/11/2015, 9:05 AM

## 2015-06-11 NOTE — Anesthesia Postprocedure Evaluation (Deleted)
Anesthesia Post Note  Patient: Allayne GitelmanSonja W Hewitt  Procedure(s) Performed: Procedure(s) (LRB): TOTAL LEFT KNEE ARTHROPLASTY (Left)  Patient location during evaluation: PACU Anesthesia Type: Spinal Level of consciousness: oriented and awake and alert Pain management: pain level controlled Vital Signs Assessment: post-procedure vital signs reviewed and stable Respiratory status: spontaneous breathing, respiratory function stable and patient connected to nasal cannula oxygen Cardiovascular status: blood pressure returned to baseline and stable Postop Assessment: No backache and No signs of nausea or vomiting Anesthetic complications: no    Last Vitals:  Filed Vitals:   06/11/15 1046 06/11/15 1138  BP: 114/67 120/64  Pulse: 62 70  Temp: 36.3 C 36.6 C  Resp:  15    Last Pain: There were no vitals filed for this visit.  LLE Motor Response: No movement due to regional block LLE Sensation: Decreased (due to spinal) RLE Motor Response: No movement due to regional block RLE Sensation: Decreased (due to spinal) L Sensory Level: L5-Outer lower leg, top of foot, great toe R Sensory Level: L4-Anterior knee, lower leg  Elvin Mccartin J

## 2015-06-11 NOTE — Anesthesia Postprocedure Evaluation (Signed)
Anesthesia Post Note  Patient: Rachel Velez  Procedure(s) Performed: Procedure(s) (LRB): TOTAL LEFT KNEE ARTHROPLASTY (Left)  Patient location during evaluation: PACU Anesthesia Type: Spinal Level of consciousness: oriented and awake and alert Pain management: pain level controlled Vital Signs Assessment: post-procedure vital signs reviewed and stable Respiratory status: spontaneous breathing, respiratory function stable and patient connected to nasal cannula oxygen Cardiovascular status: blood pressure returned to baseline and stable Postop Assessment: No backache and No signs of nausea or vomiting Anesthetic complications: no    Last Vitals:  Filed Vitals:   06/11/15 1236 06/11/15 1351  BP: 120/68 126/63  Pulse: 72 74  Temp: 36.8 C 37.1 C  Resp: 15 15    Last Pain:  Filed Vitals:   06/11/15 1351  PainSc: 3     LLE Motor Response: Purposeful movement LLE Sensation: Full sensation RLE Motor Response: Purposeful movement RLE Sensation: Full sensation L Sensory Level: S1-Sole of foot, small toes R Sensory Level: S1-Sole of foot, small toes  Rachel Velez

## 2015-06-11 NOTE — Transfer of Care (Signed)
Immediate Anesthesia Transfer of Care Note  Patient: Rachel Velez  Procedure(s) Performed: Procedure(s): TOTAL LEFT KNEE ARTHROPLASTY (Left)  Patient Location: PACU  Anesthesia Type:Spinal  Level of Consciousness:  sedated, patient cooperative and responds to stimulation  Airway & Oxygen Therapy:Patient Spontanous Breathing and Patient connected to face mask oxgen  Post-op Assessment:  Report given to PACU RN and Post -op Vital signs reviewed and stable  Post vital signs:  Reviewed and stable  Last Vitals:  Filed Vitals:   06/11/15 0609  BP: 127/78  Pulse: 99  Temp: 37 C  Resp: 18    Complications: No apparent anesthesia complications

## 2015-06-12 LAB — BASIC METABOLIC PANEL
ANION GAP: 5 (ref 5–15)
BUN: 8 mg/dL (ref 6–20)
CHLORIDE: 105 mmol/L (ref 101–111)
CO2: 29 mmol/L (ref 22–32)
Calcium: 8.6 mg/dL — ABNORMAL LOW (ref 8.9–10.3)
Creatinine, Ser: 0.55 mg/dL (ref 0.44–1.00)
GFR calc non Af Amer: >60 mL/min (ref >60)
GLUCOSE: 157 mg/dL — AB (ref 65–99)
Potassium: 3.6 mmol/L (ref 3.5–5.1)
Sodium: 139 mmol/L (ref 135–145)

## 2015-06-12 LAB — CBC
HEMATOCRIT: 31.9 % — AB (ref 36.0–46.0)
HEMOGLOBIN: 10.6 g/dL — AB (ref 12.0–15.0)
MCH: 30.7 pg (ref 26.0–34.0)
MCHC: 33.2 g/dL (ref 30.0–36.0)
MCV: 92.5 fL (ref 78.0–100.0)
Platelets: 180 10*3/uL (ref 150–400)
RBC: 3.45 MIL/uL — ABNORMAL LOW (ref 3.87–5.11)
RDW: 12.4 % (ref 11.5–15.5)
WBC: 11 10*3/uL — ABNORMAL HIGH (ref 4.0–10.5)

## 2015-06-12 MED ORDER — OXYCODONE HCL 5 MG PO TABS: 5.0000 mg | ORAL_TABLET | ORAL | Status: DC | PRN

## 2015-06-12 MED ORDER — TRAMADOL HCL 50 MG PO TABS: 50.0000 mg | ORAL_TABLET | Freq: Four times a day (QID) | ORAL | Status: DC | PRN

## 2015-06-12 MED ORDER — RIVAROXABAN 10 MG PO TABS: 10.0000 mg | ORAL_TABLET | Freq: Every day | ORAL | Status: DC

## 2015-06-12 MED ORDER — METHOCARBAMOL 500 MG PO TABS: 500.0000 mg | ORAL_TABLET | Freq: Four times a day (QID) | ORAL | Status: DC | PRN

## 2015-06-12 NOTE — Care Management Note (Signed)
Case Management Note  Patient Details  Name: Allayne GitelmanSonja W Terrones MRN: [id number] Date of Birth: [birth year]  Subjective/Objective:     S/p Left Total Knee Arthroplasty               Action/Plan: Discharge planning, spoke with patient at bedside. States she will d/c home with assistance from her boyfriend and mother. [facility], contacted [facility] for referral. No DME needs, has all equipment at home.   Expected Discharge Date:                  Expected Discharge Plan:  Home w Home Health Services  In-House Referral:  NA  Discharge planning Services  CM Consult  Post Acute Care Choice:  Home Health Choice offered to:  Patient  DME Arranged:  N/A DME Agency:  NA  HH Arranged:  PT HH Agency:  [facility]  Status of Service:  Completed, signed off  Medicare Important Message Given:    Date Medicare IM Given:    Medicare IM give by:    Date Additional Medicare IM Given:    Additional Medicare Important Message give by:     If discussed at Long Length of Stay Meetings, dates discussed:    Additional Comments:  Alexis Goodelleele, Grayson Pfefferle K, RN 06/12/2015, 1:46 PM

## 2015-06-12 NOTE — Progress Notes (Addendum)
PT TX NOTE  06/12/15 1100  PT Visit Information  Last PT Received On 06/12/15  Assistance Needed +1  History of Present Illness s/p L TKA, history of R TKA 01/2015  Precautions  Precautions Knee;Fall  Required Braces or Orthoses Knee Immobilizer - Left  Knee Immobilizer - Left Discontinue once straight leg raise with < 10 degree lag  Restrictions  Weight Bearing Restrictions No  LLE Weight Bearing WBAT  Pain Assessment  Pain Assessment 0-10  Pain Score 8  Pain Location L knee during movement   Pain Descriptors / Indicators Aching;Sore  Pain Intervention(s) Limited activity within patient's tolerance;Monitored during session;Repositioned;RN gave pain meds during session;Ice applied  Cognition  Arousal/Alertness Awake/alert  Behavior During Therapy WFL for tasks assessed/performed  Overall Cognitive Status Within Functional Limits for tasks assessed  Bed Mobility  Overal bed mobility Needs Assistance  Bed Mobility Supine to Sit;Sit to Supine  Supine to sit Min assist;HOB elevated  Sit to supine Min assist  General bed mobility comments min cues for self-assit with L LE, min assist to steady, pt feeling very nauseaus and vomitted as she sat up on EOB, assisted pt back to bed, RN notified and came to give pt nausea medicine   Transfers  General transfer comment didn't transfer this am d/t pt extremely nauseous   Ambulation/Gait  General Gait Details didn't amb this am d/t pt extremely nauseaus, peformed exercises   Exercises  Exercises (pt received handout with exercises)  Total Joint Exercises  Ankle Circles/Pumps AROM;Both;20 reps (all exercises peformed in supine)  Quad Sets Strengthening;Left;10 reps  Towel Squeeze Both;Strengthening;10 reps  Short Arc Quad AROM;Strengthening;Left;5 reps (painful)  Heel Slides AAROM;Left;10 reps (painful)  Hip ABduction/ADduction AROM;Left;10 reps  Straight Leg Raises AAROM;Left;5 reps (painful)  Goniometric ROM L knee flexion 55 deg   PT - End of Session  Equipment Utilized During Treatment Gait belt  Activity Tolerance Patient limited by fatigue;Treatment limited secondary to medical complications (Comment);No increased pain (nausea and vomiting )  Patient left in bed;with bed alarm set;with nursing/sitter in room;with call bell/phone within reach  Nurse Communication Mobility status  PT Plan  PT Frequency (ACUTE ONLY) 7X/week  PT Recommendation  Follow Up Recommendations Home health PT;Supervision for mobility/OOB  PT equipment None recommended by PT  Acute Rehab PT Goals  Patient Stated Goal to go home  PT Goal Formulation With patient  Time For Goal Achievement 06/18/15  Potential to Achieve Goals Good  PT Time Calculation  PT Start Time (ACUTE ONLY) 1017  PT Stop Time (ACUTE ONLY) 1050  PT Time Calculation (min) (ACUTE ONLY) 33 min  PT General Charges  $$ ACUTE PT VISIT 1 Procedure  PT Treatments  $Gait Training 8-22 mins  $Therapeutic Activity 8-22 mins  Ana Steed, SPT 06/12/2015 Reviewed and agree with findings. Drucilla Chaletara Mackenzey Crownover, PT  [phone]

## 2015-06-12 NOTE — Progress Notes (Signed)
   Subjective: 1 Day Post-Op Procedure(s) (LRB): TOTAL LEFT KNEE ARTHROPLASTY (Left) Patient reports pain as mild.   Patient seen in rounds with Dr. Lequita HaltAluisio. Patient is well, but has had some minor complaints of pain in the knee, requiring pain medications We will resume therapy today. She walked about 40 feet day of surgery. Plan is to go Home after hospital stay.  Objective: Vital signs in last 24 hours: Temp:  [97.3 F (36.3 C)-98.7 F (37.1 C)] 97.8 F (36.6 C) (11/29 0620) Pulse Rate:  [62-74] 66 (11/29 0620) Resp:  [11-15] 15 (11/29 0620) BP: (91-126)/(62-75) 94/68 mmHg (11/29 0620) SpO2:  [95 %-100 %] 95 % (11/29 0620)  Intake/Output from previous day:  Intake/Output Summary (Last 24 hours) at 06/12/15 0814 Last data filed at 06/12/15 0700  Gross per 24 hour  Intake   3235 ml  Output   3530 ml  Net   -295 ml    Intake/Output this shift: UOP 850 since around [place]  Labs:  Recent Labs  06/12/15 0443  HGB 10.6*    Recent Labs  06/12/15 0443  WBC 11.0*  RBC 3.45*  HCT 31.9*  PLT 180    Recent Labs  06/12/15 0443  NA 139  K 3.6  CL 105  CO2 29  BUN 8  CREATININE 0.55  GLUCOSE 157*  CALCIUM 8.6*   No results for input(s): LABPT, INR in the last 72 hours.  EXAM General - Patient is Alert, Appropriate and Oriented Extremity - Neurovascular intact Sensation intact distally Dorsiflexion/Plantar flexion intact Dressing - dressing C/D/I Motor Function - intact, moving foot and toes well on exam.  Hemovac pulled without difficulty.  Past Medical History  Diagnosis Date  . Complication of anesthesia   . PONV (postoperative nausea and vomiting)   . Environmental allergies   . Arthritis   . Vertigo   . Asthma     Assessment/Plan: 1 Day Post-Op Procedure(s) (LRB): TOTAL LEFT KNEE ARTHROPLASTY (Left) Principal Problem:   OA (osteoarthritis) of knee  Estimated body mass index is 26.76 kg/(m^2) as calculated from the following:   Height as of  this encounter: 5\' 3"  (1.6 m).   Weight as of this encounter: 68.493 kg (151 lb). Advance diet Up with therapy Plan for discharge tomorrow Discharge home with home health  DVT Prophylaxis - Xarelto Weight-Bearing as tolerated to left leg D/C O2 and Pulse OX and try on Room Air  Avel Peacerew Glorious Flicker, PA-C Orthopaedic Surgery 06/12/2015, 8:14 AM

## 2015-06-12 NOTE — Progress Notes (Signed)
PT TX NOTE  06/12/15 1500  PT Visit Information  Last PT Received On 06/12/15  Assistance Needed +1  History of Present Illness s/p L TKA, history of R TKA 01/2015  PT Time Calculation  PT Start Time (ACUTE ONLY) 1456  PT Stop Time (ACUTE ONLY) 1517  PT Time Calculation (min) (ACUTE ONLY) 21 min  Subjective Data  Subjective Feeling better, not as nauseaus. Ready to work with PT  Patient Stated Goal to go home  Precautions  Precautions Knee;Fall  Required Braces or Orthoses Knee Immobilizer - Left  Knee Immobilizer - Left Discontinue once straight leg raise with < 10 degree lag  Restrictions  Weight Bearing Restrictions No  LLE Weight Bearing WBAT  Pain Assessment  Pain Assessment 0-10  Pain Score 4  Pain Location L knee, worse during WB  Pain Descriptors / Indicators Aching;Sore  Pain Intervention(s) Limited activity within patient's tolerance;Monitored during session;Premedicated before session;Repositioned;Ice applied  Cognition  Arousal/Alertness Awake/alert  Behavior During Therapy WFL for tasks assessed/performed  Overall Cognitive Status Within Functional Limits for tasks assessed  Bed Mobility  Overal bed mobility Needs Assistance  Bed Mobility Supine to Sit  Supine to sit Min assist;HOB elevated  General bed mobility comments min assist with LLE, cues for using bilat UEs to self-assist with trunk  Transfers  Overall transfer level Needs assistance  Equipment used Rolling walker (2 wheeled)  Transfers Sit to/from Stand  Sit to Stand Min guard  General transfer comment mod cues for LLE extension and hand placement, min guard for safety  Ambulation/Gait  Ambulation/Gait assistance Min guard  Ambulation Distance (Feet) 100 Feet  Assistive device Rolling walker (2 wheeled)  Gait Pattern/deviations Step-to pattern;Step-through pattern;Decreased weight shift to left;Antalgic  General Gait Details min cues for bilat LE sequence and RW positioning, min guard for safety,  pt states that it's painful to WB on LLE, but she is able to tolerate it and overcome the pain. Pt cannot tolerate narcotic pain medicine d/t increase in N/V   Gait velocity decreased  PT - End of Session  Equipment Utilized During Treatment Gait belt  Activity Tolerance Patient tolerated treatment well  Patient left in chair;with chair alarm set;with call bell/phone within reach;with family/visitor present  Nurse Communication Mobility status  PT - Assessment/Plan  PT Plan Current plan remains appropriate  PT Frequency (ACUTE ONLY) 7X/week  Follow Up Recommendations Home health PT;Supervision for mobility/OOB  PT equipment None recommended by PT  PT Goal Progression  Progress towards PT goals Progressing toward goals  Acute Rehab PT Goals  PT Goal Formulation With patient  Time For Goal Achievement 06/18/15  Potential to Achieve Goals Good  PT General Charges  $$ ACUTE PT VISIT 1 Procedure  PT Treatments  $Gait Training 8-22 mins  Durward Mallardna Shamyah Stantz, SPT 06/12/2015

## 2015-06-12 NOTE — Progress Notes (Signed)
Patient is alert and oriented. Vs stable. Patient has had two episodes of vomitting after eating, PRN zofran IV given with little relief and then IV reglan given with better relief. IV toradol given for pain while nauseous. Patient tolerated potato soup for lunch with no emesis. No s/s of acute distress. Will continue to monitor.

## 2015-06-12 NOTE — Discharge Summary (Signed)
Physician Discharge Summary   Patient ID: Rachel Velez MRN: [id number] DOB/AGE: 1955-11-11 59 y.o.  Admit date: 06/11/2015 Discharge date: 06/13/2015  Primary Diagnosis:  Osteoarthritis Left knee(s) Admission Diagnoses:  Past Medical History  Diagnosis Date  . Complication of anesthesia   . PONV (postoperative nausea and vomiting)   . Environmental allergies   . Arthritis   . Vertigo   . Asthma    Discharge Diagnoses:   Principal Problem:   OA (osteoarthritis) of knee  Estimated body mass index is 26.76 kg/(m^2) as calculated from the following:   Height as of this encounter: $RemoveBeforeD'5\' 3"'[id number]$  (1.6 m).   Weight as of this encounter: 68.493 kg (151 lb).  Procedure:  Procedure(s) (LRB): TOTAL LEFT KNEE ARTHROPLASTY (Left)   Consults: None  HPI: Rachel Velez is a 59 y.o. year old female with end stage OA of her left knee with progressively worsening pain and dysfunction. She has constant pain, with activity and at rest and significant functional deficits with difficulties even with ADLs. She has had extensive non-op management including analgesics, injections of cortisone and viscosupplements, and home exercise program, but remains in significant pain with significant dysfunction. Radiographs show bone on bone arthritis medial and patellofemoral. She presents now for left Total Knee Arthroplasty.  Laboratory Data: Admission on 06/11/2015  Component Date Value Ref Range Status  . WBC 06/12/2015 11.0* 4.0 - 10.5 K/uL Final  . RBC 06/12/2015 3.45* 3.87 - 5.11 MIL/uL Final  . Hemoglobin 06/12/2015 10.6* 12.0 - 15.0 g/dL Final  . HCT 06/12/2015 31.9* 36.0 - 46.0 % Final  . MCV 06/12/2015 92.5  78.0 - 100.0 fL Final  . MCH 06/12/2015 30.7  26.0 - 34.0 pg Final  . MCHC 06/12/2015 33.2  30.0 - 36.0 g/dL Final  . RDW 06/12/2015 12.4  11.5 - 15.5 % Final  . Platelets 06/12/2015 180  150 - 400 K/uL Final  . Sodium 06/12/2015 139  135 - 145 mmol/L Final  . Potassium 06/12/2015 3.6  3.5 - 5.1  mmol/L Final  . Chloride 06/12/2015 105  101 - 111 mmol/L Final  . CO2 06/12/2015 29  22 - 32 mmol/L Final  . Glucose, Bld 06/12/2015 157* 65 - 99 mg/dL Final  . BUN 06/12/2015 8  6 - 20 mg/dL Final  . Creatinine, Ser 06/12/2015 0.55  0.44 - 1.00 mg/dL Final  . Calcium 06/12/2015 8.6* 8.9 - 10.3 mg/dL Final  . GFR calc non Af Amer 06/12/2015 >60  >60 mL/min Final  . GFR calc Af Amer 06/12/2015 >60  >60 mL/min Final   Comment: (NOTE) The eGFR has been calculated using the CKD EPI equation. This calculation has not been validated in all clinical situations. eGFR's persistently <60 mL/min signify possible Chronic Kidney Disease.   Georgiann Hahn gap 06/12/2015 5  5 - 15 Final  Hospital Outpatient Visit on 06/04/2015  Component Date Value Ref Range Status  . MRSA, PCR 06/04/2015 NEGATIVE  NEGATIVE Final  . Staphylococcus aureus 06/04/2015 NEGATIVE  NEGATIVE Final   Comment:        The Xpert SA Assay (FDA approved for NASAL specimens in patients over 107 years of age), is one component of a comprehensive surveillance program.  Test performance has been validated by [facility] for patients greater than or equal to 42 year old. It is not intended to diagnose infection nor to guide or monitor treatment.   Marland Kitchen aPTT 06/04/2015 30  24 - 37 seconds Final  . WBC 06/04/2015 5.4  4.0 - 10.5 K/uL Final  . RBC 06/04/2015 4.39  3.87 - 5.11 MIL/uL Final  . Hemoglobin 06/04/2015 13.4  12.0 - 15.0 g/dL Final  . HCT 06/04/2015 40.6  36.0 - 46.0 % Final  . MCV 06/04/2015 92.5  78.0 - 100.0 fL Final  . MCH 06/04/2015 30.5  26.0 - 34.0 pg Final  . MCHC 06/04/2015 33.0  30.0 - 36.0 g/dL Final  . RDW 06/04/2015 12.5  11.5 - 15.5 % Final  . Platelets 06/04/2015 204  150 - 400 K/uL Final  . Sodium 06/04/2015 140  135 - 145 mmol/L Final  . Potassium 06/04/2015 3.9  3.5 - 5.1 mmol/L Final  . Chloride 06/04/2015 104  101 - 111 mmol/L Final  . CO2 06/04/2015 29  22 - 32 mmol/L Final  . Glucose, Bld 06/04/2015  85  65 - 99 mg/dL Final  . BUN 06/04/2015 15  6 - 20 mg/dL Final  . Creatinine, Ser 06/04/2015 0.68  0.44 - 1.00 mg/dL Final  . Calcium 06/04/2015 9.3  8.9 - 10.3 mg/dL Final  . Total Protein 06/04/2015 7.3  6.5 - 8.1 g/dL Final  . Albumin 06/04/2015 4.4  3.5 - 5.0 g/dL Final  . AST 06/04/2015 21  15 - 41 U/L Final  . ALT 06/04/2015 16  14 - 54 U/L Final  . Alkaline Phosphatase 06/04/2015 48  38 - 126 U/L Final  . Total Bilirubin 06/04/2015 0.7  0.3 - 1.2 mg/dL Final  . GFR calc non Af Amer 06/04/2015 >60  >60 mL/min Final  . GFR calc Af Amer 06/04/2015 >60  >60 mL/min Final   Comment: (NOTE) The eGFR has been calculated using the CKD EPI equation. This calculation has not been validated in all clinical situations. eGFR's persistently <60 mL/min signify possible Chronic Kidney Disease.   . Anion gap 06/04/2015 7  5 - 15 Final  . Prothrombin Time 06/04/2015 13.6  11.6 - 15.2 seconds Final  . INR 06/04/2015 1.02  0.00 - 1.49 Final  . ABO/RH(D) 06/04/2015 O POS   Final  . Antibody Screen 06/04/2015 NEG   Final  . Sample Expiration 06/04/2015 06/14/2015   Final  . Extend sample reason 06/04/2015 NO TRANSFUSIONS OR PREGNANCY IN THE PAST 3 MONTHS   Final  . Color, Urine 06/04/2015 YELLOW  YELLOW Final  . APPearance 06/04/2015 CLEAR  CLEAR Final  . Specific Gravity, Urine 06/04/2015 1.006  1.005 - 1.030 Final  . pH 06/04/2015 6.5  5.0 - 8.0 Final  . Glucose, UA 06/04/2015 NEGATIVE  NEGATIVE mg/dL Final  . Hgb urine dipstick 06/04/2015 NEGATIVE  NEGATIVE Final  . Bilirubin Urine 06/04/2015 NEGATIVE  NEGATIVE Final  . Ketones, ur 06/04/2015 NEGATIVE  NEGATIVE mg/dL Final  . Protein, ur 06/04/2015 NEGATIVE  NEGATIVE mg/dL Final  . Nitrite 06/04/2015 NEGATIVE  NEGATIVE Final  . Leukocytes, UA 06/04/2015 NEGATIVE  NEGATIVE Final   MICROSCOPIC NOT DONE ON URINES WITH NEGATIVE PROTEIN, BLOOD, LEUKOCYTES, NITRITE, OR GLUCOSE <1000 mg/dL.     X-Rays:No results found.  EKG:No orders found for  this or any previous visit.   Hospital Course: Rachel Velez is a 59 y.o. who was admitted to [facility]. They were brought to the operating room on 06/11/2015 and underwent Procedure(s): TOTAL LEFT KNEE ARTHROPLASTY.  Patient tolerated the procedure well and was later transferred to the recovery room and then to the orthopaedic floor for postoperative care.  They were given PO and IV analgesics for pain control following their  surgery.  They were given 24 hours of postoperative antibiotics of  Anti-infectives    Start     Dose/Rate Route Frequency Ordered Stop   06/11/15 2000  vancomycin (VANCOCIN) IVPB 1000 mg/200 mL premix     1,000 mg 200 mL/hr over 60 Minutes Intravenous Every 12 hours 06/11/15 1119 06/11/15 2059   06/11/15 0700  vancomycin (VANCOCIN) IVPB 1000 mg/200 mL premix     1,000 mg 200 mL/hr over 60 Minutes Intravenous On call to O.R. 06/11/15 6861 06/11/15 0851     and started on DVT prophylaxis in the form of Xarelto.   PT and OT were ordered for total joint protocol.  Discharge planning consulted to help with postop disposition and equipment needs.  Patient had a decent night on the evening of surgery.  They started to get up OOB with therapy on day one. Hemovac drain was pulled without difficulty.  Continued to work with therapy into day two.  Dressing was changed on day two and the incision was healing well.  Patient was seen in rounds on day two and was ready to go home.   Diet: Regular diet Activity:WBAT Follow-up:in 2 weeks Disposition - Home Discharged Condition: good   Discharge Instructions    Call MD / Call 911    Complete by:  As directed   If you experience chest pain or shortness of breath, CALL 911 and be transported to the hospital emergency room.  If you develope a fever above 101 F, pus (white drainage) or increased drainage or redness at the wound, or calf pain, call your surgeon's office.     Change dressing    Complete by:  As directed    Change dressing daily with sterile 4 x 4 inch gauze dressing and apply TED hose. Do not submerge the incision under water.     Constipation Prevention    Complete by:  As directed   Drink plenty of fluids.  Prune juice may be helpful.  You may use a stool softener, such as Colace (over the counter) 100 mg twice a day.  Use MiraLax (over the counter) for constipation as needed.     Diet general    Complete by:  As directed      Discharge instructions    Complete by:  As directed   Pick up stool softner and laxative for home use following surgery while on pain medications. Do not submerge incision under water. Please use good hand washing techniques while changing dressing each day. May shower starting three days after surgery. Please use a clean towel to pat the incision dry following showers. Continue to use ice for pain and swelling after surgery. Do not use any lotions or creams on the incision until instructed by your surgeon.  Take Xarelto for two and a half more weeks, then discontinue Xarelto. Once the patient has completed the blood thinner regimen, then take a Baby 81 mg Aspirin daily for three more weeks.  Postoperative Constipation Protocol  Constipation - defined medically as fewer than three stools per week and severe constipation as less than one stool per week.  One of the most common issues patients have following surgery is constipation.  Even if you have a regular bowel pattern at home, your normal regimen is likely to be disrupted due to multiple reasons following surgery.  Combination of anesthesia, postoperative narcotics, change in appetite and fluid intake all can affect your bowels.  In order to avoid complications following surgery, here are  some recommendations in order to help you during your recovery period.  Colace (docusate) - Pick up an over-the-counter form of Colace or another stool softener and take twice a day as long as you are requiring postoperative pain  medications.  Take with a full glass of water daily.  If you experience loose stools or diarrhea, hold the colace until you stool forms back up.  If your symptoms do not get better within 1 week or if they get worse, check with your doctor.  Dulcolax (bisacodyl) - Pick up over-the-counter and take as directed by the product packaging as needed to assist with the movement of your bowels.  Take with a full glass of water.  Use this product as needed if not relieved by Colace only.   MiraLax (polyethylene glycol) - Pick up over-the-counter to have on hand.  MiraLax is a solution that will increase the amount of water in your bowels to assist with bowel movements.  Take as directed and can mix with a glass of water, juice, soda, coffee, or tea.  Take if you go more than two days without a movement. Do not use MiraLax more than once per day. Call your doctor if you are still constipated or irregular after using this medication for 7 days in a row.  If you continue to have problems with postoperative constipation, please contact the office for further assistance and recommendations.  If you experience "the worst abdominal pain ever" or develop nausea or vomiting, please contact the office immediatly for further recommendations for treatment.     Do not put a pillow under the knee. Place it under the heel.    Complete by:  As directed      Do not sit on low chairs, stoools or toilet seats, as it may be difficult to get up from low surfaces    Complete by:  As directed      Driving restrictions    Complete by:  As directed   No driving until released by the physician.     Increase activity slowly as tolerated    Complete by:  As directed      Lifting restrictions    Complete by:  As directed   No lifting until released by the physician.     Patient may shower    Complete by:  As directed   You may shower without a dressing once there is no drainage.  Do not wash over the wound.  If drainage remains, do  not shower until drainage stops.     TED hose    Complete by:  As directed   Use stockings (TED hose) for 3 weeks on both leg(s).  You may remove them at night for sleeping.     Weight bearing as tolerated    Complete by:  As directed   Laterality:  left  Extremity:  Lower            Medication List    STOP taking these medications        calcium carbonate 1500 (600 CA) MG Tabs tablet  Commonly known as:  OSCAL     cholecalciferol 1000 UNITS tablet  Commonly known as:  VITAMIN D     glucosamine-chondroitin 500-400 MG tablet     MINIVELLE 0.05 MG/24HR patch  Generic drug:  estradiol     naproxen sodium 220 MG tablet  Commonly known as:  ANAPROX     Omega 3 1200 MG Caps  TAKE these medications        methocarbamol 500 MG tablet  Commonly known as:  ROBAXIN  Take 1 tablet (500 mg total) by mouth every 6 (six) hours as needed for muscle spasms.     oxyCODONE 5 MG immediate release tablet  Commonly known as:  Oxy IR/ROXICODONE  Take 1-2 tablets (5-10 mg total) by mouth every 3 (three) hours as needed for moderate pain or severe pain.     rivaroxaban 10 MG Tabs tablet  Commonly known as:  XARELTO  Take 1 tablet (10 mg total) by mouth daily with breakfast. Take Xarelto for two and a half more weeks, then discontinue Xarelto. Once the patient has completed the blood thinner regimen, then take a Baby 81 mg Aspirin daily for three more weeks.     traMADol 50 MG tablet  Commonly known as:  ULTRAM  Take 1-2 tablets (50-100 mg total) by mouth every 6 (six) hours as needed (mild pain).           Follow-up Information    Follow up with [facility].   Why:  Physical therapy   Contact information:   [address] [place] [address] [phone]       Follow up with Gearlean Alf, MD. Schedule an appointment as soon as possible for a visit on 06/26/2015.   Specialty:  Orthopedic Surgery   Why:  Call office at [phone] to setup appointment  on Tuesday 06/26/15 with Dr. Wynelle Link.   Contact information:   [address] [place] [address] [phone]       Signed: Arlee Muslim, PA-C Orthopaedic Surgery 06/12/2015, 8:21 PM

## 2015-06-12 NOTE — Discharge Instructions (Addendum)
° °Dr. Frank Aluisio °Total Joint Specialist °[facility] °[address] °[place], [place] [address] °[phone] ° °TOTAL KNEE REPLACEMENT POSTOPERATIVE DIRECTIONS ° °Knee Rehabilitation, Guidelines Following Surgery  °Results after knee surgery are often greatly improved when you follow the exercise, range of motion and muscle strengthening exercises prescribed by your doctor. Safety measures are also important to protect the knee from further injury. Any time any of these exercises cause you to have increased pain or swelling in your knee joint, decrease the amount until you are comfortable again and slowly increase them. If you have problems or questions, call your caregiver or physical therapist for advice.  ° °HOME CARE INSTRUCTIONS  °Remove items at home which could result in a fall. This includes throw rugs or furniture in walking pathways.  °· ICE to the affected knee every three hours for 30 minutes at a time and then as needed for pain and swelling.  Continue to use ice on the knee for pain and swelling from surgery. You may notice swelling that will progress down to the foot and ankle.  This is normal after surgery.  Elevate the leg when you are not up walking on it.   °· Continue to use the breathing machine which will help keep your temperature down.  It is common for your temperature to cycle up and down following surgery, especially at night when you are not up moving around and exerting yourself.  The breathing machine keeps your lungs expanded and your temperature down. °· Do not place pillow under knee, focus on keeping the knee straight while resting ° °DIET °You may resume your previous home diet once your are discharged from the hospital. ° °DRESSING / WOUND CARE / SHOWERING °You may shower 3 days after surgery, but keep the wounds dry during showering.  You may use an occlusive plastic wrap (Press'n Seal for example), NO SOAKING/SUBMERGING IN THE BATHTUB.  If the  bandage gets wet, change with a clean dry gauze.  If the incision gets wet, pat the wound dry with a clean towel. °You may start showering once you are discharged home but do not submerge the incision under water. Just pat the incision dry and apply a dry gauze dressing on daily. °Change the surgical dressing daily and reapply a dry dressing each time. ° °ACTIVITY °Walk with your walker as instructed. °Use walker as long as suggested by your caregivers. °Avoid periods of inactivity such as sitting longer than an hour when not asleep. This helps prevent blood clots.  °You may resume a sexual relationship in one month or when given the OK by your doctor.  °You may return to work once you are cleared by your doctor.  °Do not drive a car for 6 weeks or until released by you surgeon.  °Do not drive while taking narcotics. ° °WEIGHT BEARING °Weight bearing as tolerated with assist device (walker, cane, etc) as directed, use it as long as suggested by your surgeon or therapist, typically at least 4-6 weeks. ° °POSTOPERATIVE CONSTIPATION PROTOCOL °Constipation - defined medically as fewer than three stools per week and severe constipation as less than one stool per week. ° °One of the most common issues patients have following surgery is constipation.  Even if you have a regular bowel pattern at home, your normal regimen is likely to be disrupted due to multiple reasons following surgery.  Combination of anesthesia, postoperative narcotics, change in appetite and fluid intake all can affect your bowels.    In order to avoid complications following surgery, here are some recommendations in order to help you during your recovery period. ° °Colace (docusate) - Pick up an over-the-counter form of Colace or another stool softener and take twice a day as long as you are requiring postoperative pain medications.  Take with a full glass of water daily.  If you experience loose stools or diarrhea, hold the colace until you stool forms  back up.  If your symptoms do not get better within 1 week or if they get worse, check with your doctor. ° °Dulcolax (bisacodyl) - Pick up over-the-counter and take as directed by the product packaging as needed to assist with the movement of your bowels.  Take with a full glass of water.  Use this product as needed if not relieved by Colace only.  ° °MiraLax (polyethylene glycol) - Pick up over-the-counter to have on hand.  MiraLax is a solution that will increase the amount of water in your bowels to assist with bowel movements.  Take as directed and can mix with a glass of water, juice, soda, coffee, or tea.  Take if you go more than two days without a movement. °Do not use MiraLax more than once per day. Call your doctor if you are still constipated or irregular after using this medication for 7 days in a row. ° °If you continue to have problems with postoperative constipation, please contact the office for further assistance and recommendations.  If you experience "the worst abdominal pain ever" or develop nausea or vomiting, please contact the office immediatly for further recommendations for treatment. ° °ITCHING ° If you experience itching with your medications, try taking only a single pain pill, or even half a pain pill at a time.  You can also use Benadryl over the counter for itching or also to help with sleep.  ° °TED HOSE STOCKINGS °Wear the elastic stockings on both legs for three weeks following surgery during the day but you may remove then at night for sleeping. ° °MEDICATIONS °See your medication summary on the “After Visit Summary” that the nursing staff will review with you prior to discharge.  You may have some home medications which will be placed on hold until you complete the course of blood thinner medication.  It is important for you to complete the blood thinner medication as prescribed by your surgeon.  Continue your approved medications as instructed at time of  discharge. ° °PRECAUTIONS °If you experience chest pain or shortness of breath - call 911 immediately for transfer to the hospital emergency department.  °If you develop a fever greater that 101 F, purulent drainage from wound, increased redness or drainage from wound, foul odor from the wound/dressing, or calf pain - CONTACT YOUR SURGEON.   °                                                °FOLLOW-UP APPOINTMENTS °Make sure you keep all of your appointments after your operation with your surgeon and caregivers. You should call the office at the above phone number and make an appointment for approximately two weeks after the date of your surgery or on the date instructed by your surgeon outlined in the "After Visit Summary". ° ° °RANGE OF MOTION AND STRENGTHENING EXERCISES  °Rehabilitation of the knee is important following a knee injury or   an operation. After just a few days of immobilization, the muscles of the thigh which control the knee become weakened and shrink (atrophy). Knee exercises are designed to build up the tone and strength of the thigh muscles and to improve knee motion. Often times heat used for twenty to thirty minutes before working out will loosen up your tissues and help with improving the range of motion but do not use heat for the first two weeks following surgery. These exercises can be done on a training (exercise) mat, on the floor, on a table or on a bed. Use what ever works the best and is most comfortable for you Knee exercises include:  °Leg Lifts - While your knee is still immobilized in a splint or cast, you can do straight leg raises. Lift the leg to 60 degrees, hold for 3 sec, and slowly lower the leg. Repeat 10-20 times 2-3 times daily. Perform this exercise against resistance later as your knee gets better.  °Quad and Hamstring Sets - Tighten up the muscle on the front of the thigh (Quad) and hold for 5-10 sec. Repeat this 10-20 times hourly. Hamstring sets are done by pushing the  foot backward against an object and holding for 5-10 sec. Repeat as with quad sets.  °· Leg Slides: Lying on your back, slowly slide your foot toward your buttocks, bending your knee up off the floor (only go as far as is comfortable). Then slowly slide your foot back down until your leg is flat on the floor again. °· Angel Wings: Lying on your back spread your legs to the side as far apart as you can without causing discomfort.  °A rehabilitation program following serious knee injuries can speed recovery and prevent re-injury in the future due to weakened muscles. Contact your doctor or a physical therapist for more information on knee rehabilitation.  ° °IF YOU ARE TRANSFERRED TO A SKILLED REHAB FACILITY °If the patient is transferred to a skilled rehab facility following release from the hospital, a list of the current medications will be sent to the facility for the patient to continue.  When discharged from the skilled rehab facility, please have the facility set up the patient's Home Health Physical Therapy prior to being released. Also, the skilled facility will be responsible for providing the patient with their medications at time of release from the facility to include their pain medication, the muscle relaxants, and their blood thinner medication. If the patient is still at the rehab facility at time of the two week follow up appointment, the skilled rehab facility will also need to assist the patient in arranging follow up appointment in our office and any transportation needs. ° °MAKE SURE YOU:  °Understand these instructions.  °Get help right away if you are not doing well or get worse.  ° ° °Pick up stool softner and laxative for home use following surgery while on pain medications. °Do not submerge incision under water. °Please use good hand washing techniques while changing dressing each day. °May shower starting three days after surgery. °Please use a clean towel to pat the incision dry following  showers. °Continue to use ice for pain and swelling after surgery. °Do not use any lotions or creams on the incision until instructed by your surgeon. ° °Take Xarelto for two and a half more weeks, then discontinue Xarelto. °Once the patient has completed the blood thinner regimen, then take a Baby 81 mg Aspirin daily for three more weeks. ° ° °Information   on my medicine - XARELTO® (Rivaroxaban) ° °This medication education was reviewed with me or my healthcare representative as part of my discharge preparation.  The pharmacist that spoke with me during my hospital stay was:  Glogovac,nikola, RPH ° °Why was Xarelto® prescribed for you? °Xarelto® was prescribed for you to reduce the risk of blood clots forming after orthopedic surgery. The medical term for these abnormal blood clots is venous thromboembolism (VTE). ° °What do you need to know about xarelto® ? °Take your Xarelto® ONCE DAILY at the same time every day. °You may take it either with or without food. ° °If you have difficulty swallowing the tablet whole, you may crush it and mix in applesauce just prior to taking your dose. ° °Take Xarelto® exactly as prescribed by your doctor and DO NOT stop taking Xarelto® without talking to the doctor who prescribed the medication.  Stopping without other VTE prevention medication to take the place of Xarelto® may increase your risk of developing a clot. ° °After discharge, you should have regular check-up appointments with your healthcare provider that is prescribing your Xarelto®.   ° °What do you do if you miss a dose? °If you miss a dose, take it as soon as you remember on the same day then continue your regularly scheduled once daily regimen the next day. Do not take two doses of Xarelto® on the same day.  ° °Important Safety Information °A possible side effect of Xarelto® is bleeding. You should call your healthcare provider right away if you experience any of the following: °? Bleeding from an injury or your  nose that does not stop. °? Unusual colored urine (red or dark brown) or unusual colored stools (red or black). °? Unusual bruising for unknown reasons. °? A serious fall or if you hit your head (even if there is no bleeding). ° °Some medicines may interact with Xarelto® and might increase your risk of bleeding while on Xarelto®. To help avoid this, consult your healthcare provider or pharmacist prior to using any new prescription or non-prescription medications, including herbals, vitamins, non-steroidal anti-inflammatory drugs (NSAIDs) and supplements. ° °This website has more information on Xarelto®: www.xarelto.com. ° ° °

## 2015-06-12 NOTE — Progress Notes (Signed)
OT Cancellation Note  Patient Details Name: Rachel GitelmanSonja W Schnider MRN: [id number] DOB: 02/04/1956   Cancelled Treatment:     Orders received and chart reviewed. Pt is a 59 y/o female s/p L TKA. OT assessment was attempted x3 today, but pt was unable to participate secondary to nausea and vomiting. RN is aware and has been giving pt medication throughout the morning today. Will defer Eval until tomorrow or as pt able to participate.  Charletta CousinBarnhill, Chevis Weisensel Beth Dixon, OTR/L 06/12/2015, 11:37 AM

## 2015-06-13 LAB — CBC
HEMATOCRIT: 31.5 % — AB (ref 36.0–46.0)
Hemoglobin: 10.1 g/dL — ABNORMAL LOW (ref 12.0–15.0)
MCH: 29.4 pg (ref 26.0–34.0)
MCHC: 32.1 g/dL (ref 30.0–36.0)
MCV: 91.8 fL (ref 78.0–100.0)
PLATELETS: 190 10*3/uL (ref 150–400)
RBC: 3.43 MIL/uL — AB (ref 3.87–5.11)
RDW: 12.4 % (ref 11.5–15.5)
WBC: 9.3 10*3/uL (ref 4.0–10.5)

## 2015-06-13 LAB — BASIC METABOLIC PANEL
Anion gap: 6 (ref 5–15)
BUN: 9 mg/dL (ref 6–20)
CHLORIDE: 104 mmol/L (ref 101–111)
CO2: 29 mmol/L (ref 22–32)
Calcium: 8.8 mg/dL — ABNORMAL LOW (ref 8.9–10.3)
Creatinine, Ser: 0.56 mg/dL (ref 0.44–1.00)
GFR calc Af Amer: >60 mL/min (ref >60)
GLUCOSE: 101 mg/dL — AB (ref 65–99)
POTASSIUM: 3.8 mmol/L (ref 3.5–5.1)
Sodium: 139 mmol/L (ref 135–145)

## 2015-06-13 MED ORDER — ONDANSETRON HCL 4 MG PO TABS: 4.0000 mg | ORAL_TABLET | Freq: Four times a day (QID) | ORAL | Status: DC | PRN

## 2015-06-13 NOTE — Progress Notes (Signed)
PT TX NOTE  06/13/15 1000  PT Visit Information  Last PT Received On 06/13/15  Assistance Needed +1  History of Present Illness s/p L TKA, history of R TKA 01/2015  PT Time Calculation  PT Start Time (ACUTE ONLY) 16100937  PT Stop Time (ACUTE ONLY) 0958  PT Time Calculation (min) (ACUTE ONLY) 21 min  Subjective Data  Subjective Feeling much better today, had a good restful night; did not have my pain medication prior to PT session, but willing to try without. Peformed stair training with pt and provided handout for stair navigation education. Answered all pt's questions, she is ready for d/c.   Patient Stated Goal to go home  Precautions  Precautions Knee;Fall  Required Braces or Orthoses Knee Immobilizer - Left  Knee Immobilizer - Left Discontinue once straight leg raise with < 10 degree lag  Restrictions  Weight Bearing Restrictions No  LLE Weight Bearing WBAT  Pain Assessment  Pain Assessment 0-10  Pain Score 4 (didn't have pain medication d/t nausea)  Pain Location L knee  Pain Descriptors / Indicators Sore  Pain Intervention(s) Limited activity within patient's tolerance;Monitored during session;Repositioned;Ice applied  Cognition  Arousal/Alertness Awake/alert  Behavior During Therapy WFL for tasks assessed/performed  Overall Cognitive Status Within Functional Limits for tasks assessed  Bed Mobility  General bed mobility comments pt up in recliner upon arrival  Transfers  Overall transfer level Needs assistance  Equipment used Rolling walker (2 wheeled)  Transfers Sit to/from Stand  Sit to Stand Supervision  General transfer comment min cues for hand placement and RW positioning, pt uses RLE to self assist with LLE   Ambulation/Gait  Ambulation/Gait assistance Min guard  Ambulation Distance (Feet) 60 Feet  Assistive device Rolling walker (2 wheeled)  Gait Pattern/deviations Step-to pattern;Decreased weight shift to left;Antalgic  General Gait Details min cues for LE  sequence and RW progression, min guard for safety   Gait velocity decreased  Stairs Yes  Stairs assistance Min guard  Stair Management No rails;Step to pattern;Backwards;With walker (educated her friend Onalee HuaDavid on supporting the RW )  Number of Stairs 2 (practiced twice up and down)  General stair comments min guard for safety, mod cues for correct technique, educated her partner and provided handout for reference  Exercises  Exercises (reviewed all exercises)  PT - End of Session  Equipment Utilized During Treatment Gait belt;Left knee immobilizer  Activity Tolerance Patient tolerated treatment well  Patient left in chair;with chair alarm set;with call bell/phone within reach  Nurse Communication Mobility status  PT - Assessment/Plan  PT Plan Current plan remains appropriate  PT Frequency (ACUTE ONLY) 7X/week  Follow Up Recommendations Home health PT;Supervision for mobility/OOB  PT equipment None recommended by PT  PT Goal Progression  Progress towards PT goals Progressing toward goals  Acute Rehab PT Goals  PT Goal Formulation With patient  Time For Goal Achievement 06/18/15  Potential to Achieve Goals Good  PT General Charges  $$ ACUTE PT VISIT 1 Procedure  PT Treatments  $Gait Training 8-22 mins  Durward Mallardna Lenard Kampf, SPT 06/13/2015

## 2015-06-13 NOTE — Evaluation (Signed)
Occupational Therapy Evaluation Patient Details Name: Rachel Velez MRN: [id number] DOB: [birth year] Today's Date: 06/13/2015    History of Present Illness s/p L TKA, history of R TKA 01/2015   Clinical Impression   This 59 year old female was admitted for the above.  All education was completed. No further OT is needed at this time    Follow Up Recommendations  No OT follow up    Equipment Recommendations  None recommended by OT    Recommendations for Other Services       Precautions / Restrictions Precautions Precautions: Knee;Fall Required Braces or Orthoses: Knee Immobilizer - Left Knee Immobilizer - Left: Discontinue once straight leg raise with < 10 degree lag Restrictions LLE Weight Bearing: Weight bearing as tolerated      Mobility Bed Mobility   Bed Mobility: Supine to Sit     Supine to sit: Supervision     General bed mobility comments: pt crossed R under L  Transfers   Equipment used: Rolling walker (2 wheeled) Transfers: Sit to/from Stand Sit to Stand: Supervision              Balance                                            ADL Overall ADL's : Needs assistance/impaired     Grooming: Supervision/safety;Standing   Upper Body Bathing: Set up;Standing   Lower Body Bathing: Minimal assistance;Sit to/from stand   Upper Body Dressing : Set up;Sitting   Lower Body Dressing: Minimal assistance;Sit to/from stand   Toilet Transfer: Min guard;Ambulation;Comfort height toilet             General ADL Comments: pt will have help for socks/shoes. She has a tub bench now, which son is using.  Pt verbalizes understanding of use.  Needs assistance for KI     Vision     Perception     Praxis      Pertinent Vitals/Pain Pain Score: 3  Pain Location: L knee Pain Descriptors / Indicators: Aching Pain Intervention(s): Limited activity within patient's tolerance;Monitored during session;Premedicated before  session;Repositioned;Ice applied     Hand Dominance     Extremity/Trunk Assessment Upper Extremity Assessment Upper Extremity Assessment: Overall WFL for tasks assessed           Communication Communication Communication: No difficulties;HOH   Cognition Arousal/Alertness: Awake/alert Behavior During Therapy: WFL for tasks assessed/performed Overall Cognitive Status: Within Functional Limits for tasks assessed                     General Comments       Exercises       Shoulder Instructions      Home Living Family/patient expects to be discharged to:: Private residence Living Arrangements: Children Available Help at Discharge: Family;Friend(s)               Bathroom Shower/Tub: Chief Strategy Officer: Standard     Home Equipment: Environmental consultant - 2 wheels;Bedside commode;Tub bench   Additional Comments: son was in a MVA and is NWB currently.  A friend will stay for a week      Prior Functioning/Environment Level of Independence: Independent             OT Diagnosis: Generalized weakness   OT Problem List:     OT Treatment/Interventions:  OT Goals(Current goals can be found in the care plan section) Acute Rehab OT Goals Patient Stated Goal: to go home  OT Frequency:     Barriers to D/C:            Co-evaluation              End of Session    Activity Tolerance: Patient tolerated treatment well Patient left: in chair;with call bell/phone within reach   Time: 0730-0745 OT Time Calculation (min): 15 min Charges:  OT General Charges $OT Visit: 1 Procedure OT Evaluation $Initial OT Evaluation Tier I: 1 Procedure G-Codes:    Aaliah Jorgenson 06/13/2015, 8:38 AM   Marica OtterMaryellen Aryam Zhan, OTR/L [phone] 06/13/2015

## 2015-06-13 NOTE — Progress Notes (Signed)
   Subjective: 2 Days Post-Op Procedure(s) (LRB): TOTAL LEFT KNEE ARTHROPLASTY (Left) Patient reports pain as mild.   Patient seen in rounds for Dr. Lequita HaltAluisio. Patient is well, and has had no acute complaints or problems Patient is ready to go home  Objective: Vital signs in last 24 hours: Temp:  [98.1 F (36.7 C)-98.5 F (36.9 C)] 98.1 F (36.7 C) (11/30 0541) Pulse Rate:  [69-79] 69 (11/30 0541) Resp:  [16] 16 (11/30 0541) BP: (114-127)/(67-85) 127/82 mmHg (11/30 0541) SpO2:  [97 %-99 %] 99 % (11/30 0541)  Intake/Output from previous day:  Intake/Output Summary (Last 24 hours) at 06/13/15 1037 Last data filed at 06/13/15 0919  Gross per 24 hour  Intake    697 ml  Output    650 ml  Net     47 ml    Intake/Output this shift: Total I/O In: 377 [P.O.:240; I.V.:137] Out: -   Labs:  Recent Labs  06/12/15 0443 06/13/15 0532  HGB 10.6* 10.1*    Recent Labs  06/12/15 0443 06/13/15 0532  WBC 11.0* 9.3  RBC 3.45* 3.43*  HCT 31.9* 31.5*  PLT 180 190    Recent Labs  06/12/15 0443 06/13/15 0532  NA 139 139  K 3.6 3.8  CL 105 104  CO2 29 29  BUN 8 9  CREATININE 0.55 0.56  GLUCOSE 157* 101*  CALCIUM 8.6* 8.8*   No results for input(s): LABPT, INR in the last 72 hours.  EXAM: General - Patient is Alert and Appropriate Extremity - Neurovascular intact Sensation intact distally Incision - clean, dry, no drainage Motor Function - intact, moving foot and toes well on exam.   Assessment/Plan: 2 Days Post-Op Procedure(s) (LRB): TOTAL LEFT KNEE ARTHROPLASTY (Left) Procedure(s) (LRB): TOTAL LEFT KNEE ARTHROPLASTY (Left) Past Medical History  Diagnosis Date  . Complication of anesthesia   . PONV (postoperative nausea and vomiting)   . Environmental allergies   . Arthritis   . Vertigo   . Asthma    Principal Problem:   OA (osteoarthritis) of knee  Estimated body mass index is 26.76 kg/(m^2) as calculated from the following:   Height as of this  encounter: 5\' 3"  (1.6 m).   Weight as of this encounter: 68.493 kg (151 lb). Up with therapy Discharge home with home health Diet - Regular diet Follow up - in 2 weeks Activity - WBAT Disposition - Home Condition Upon Discharge - Good D/C Meds - See DC Summary DVT Prophylaxis - Xarelto  Avel Peacerew Zaydn Gutridge, PA-C Orthopaedic Surgery 06/13/2015, 10:37 AM

## 2017-03-30 DIAGNOSIS — H8110 Benign paroxysmal vertigo, unspecified ear: Secondary | ICD-10-CM | POA: Insufficient documentation

## 2019-02-03 DIAGNOSIS — H9190 Unspecified hearing loss, unspecified ear: Secondary | ICD-10-CM | POA: Insufficient documentation

## 2019-07-31 ENCOUNTER — Ambulatory Visit: Payer: BC Managed Care – PPO | Attending: Internal Medicine

## 2019-07-31 DIAGNOSIS — Z23 Encounter for immunization: Secondary | ICD-10-CM

## 2019-07-31 NOTE — Progress Notes (Signed)
   Covid-19 Vaccination Clinic  Name:  DNYA HICKLE    MRN: [id number] DOB: August 03, 1955  07/31/2019  Ms. Jou was observed post Covid-19 immunization for 15 minutes without incidence. She was provided with Vaccine Information Sheet and instruction to access the V-Safe system.   Ms. Mikel was instructed to call 911 with any severe reactions post vaccine: Marland Kitchen Difficulty breathing  . Swelling of your face and throat  . A fast heartbeat  . A bad rash all over your body  . Dizziness and weakness

## 2019-08-18 ENCOUNTER — Ambulatory Visit: Payer: BC Managed Care – PPO | Attending: Internal Medicine

## 2019-08-18 DIAGNOSIS — Z23 Encounter for immunization: Secondary | ICD-10-CM | POA: Insufficient documentation

## 2019-08-18 NOTE — Progress Notes (Signed)
   Covid-19 Vaccination Clinic  Name:  Rachel Velez    MRN: [id number] DOB: 1956-05-25  08/18/2019  [name] was observed post Covid-19 immunization for 15 minutes without incidence. She was provided with Vaccine Information Sheet and instruction to access the V-Safe system.   [name] was instructed to call 911 with any severe reactions post vaccine: Marland Kitchen Difficulty breathing  . Swelling of your face and throat  . A fast heartbeat  . A bad rash all over your body  . Dizziness and weakness    Immunizations Administered    Name Date Dose VIS Date Route   Pfizer COVID-19 Vaccine 08/18/2019 12:21 PM 0.3 mL 06/24/2019 Intramuscular   Manufacturer: [employer], [employer]   Lot: [id number]   NDC: [address]-8

## 2019-08-26 ENCOUNTER — Other Ambulatory Visit: Payer: Self-pay | Admitting: Nephrology

## 2019-08-26 DIAGNOSIS — R7989 Other specified abnormal findings of blood chemistry: Secondary | ICD-10-CM

## 2019-08-29 ENCOUNTER — Ambulatory Visit
Admission: RE | Admit: 2019-08-29 | Discharge: 2019-08-29 | Disposition: A | Discharge status: Home / Self Care | Payer: BC Managed Care – PPO | Source: Ambulatory Visit | Attending: Nephrology | Admitting: Nephrology

## 2019-08-29 DIAGNOSIS — R7989 Other specified abnormal findings of blood chemistry: Secondary | ICD-10-CM

## 2020-01-26 DIAGNOSIS — Z96653 Presence of artificial knee joint, bilateral: Secondary | ICD-10-CM | POA: Insufficient documentation

## 2020-01-30 ENCOUNTER — Other Ambulatory Visit: Payer: Self-pay | Admitting: Family Medicine

## 2020-01-30 ENCOUNTER — Ambulatory Visit
Admission: RE | Admit: 2020-01-30 | Discharge: 2020-01-30 | Disposition: A | Discharge status: Home / Self Care | Payer: BC Managed Care – PPO | Source: Ambulatory Visit | Attending: Family Medicine | Admitting: Family Medicine

## 2020-01-30 DIAGNOSIS — R053 Chronic cough: Secondary | ICD-10-CM

## 2020-02-16 ENCOUNTER — Other Ambulatory Visit: Payer: Self-pay | Admitting: Nephrology

## 2020-02-16 DIAGNOSIS — K7689 Other specified diseases of liver: Secondary | ICD-10-CM

## 2020-03-14 ENCOUNTER — Ambulatory Visit
Admission: RE | Admit: 2020-03-14 | Discharge: 2020-03-14 | Disposition: A | Discharge status: Home / Self Care | Payer: BC Managed Care – PPO | Source: Ambulatory Visit | Attending: Nephrology | Admitting: Nephrology

## 2020-03-14 DIAGNOSIS — K7689 Other specified diseases of liver: Secondary | ICD-10-CM

## 2021-02-11 DIAGNOSIS — J309 Allergic rhinitis, unspecified: Secondary | ICD-10-CM | POA: Insufficient documentation

## 2021-04-24 ENCOUNTER — Other Ambulatory Visit: Payer: Self-pay | Admitting: Obstetrics and Gynecology

## 2021-04-24 DIAGNOSIS — Z8249 Family history of ischemic heart disease and other diseases of the circulatory system: Secondary | ICD-10-CM

## 2021-05-14 ENCOUNTER — Other Ambulatory Visit: Payer: Self-pay

## 2021-05-14 ENCOUNTER — Ambulatory Visit
Admission: RE | Admit: 2021-05-14 | Discharge: 2021-05-14 | Disposition: A | Discharge status: Home / Self Care | Payer: No Typology Code available for payment source | Source: Ambulatory Visit | Attending: Obstetrics and Gynecology | Admitting: Obstetrics and Gynecology

## 2021-05-14 DIAGNOSIS — Z8249 Family history of ischemic heart disease and other diseases of the circulatory system: Secondary | ICD-10-CM

## 2021-05-23 ENCOUNTER — Ambulatory Visit (INDEPENDENT_AMBULATORY_CARE_PROVIDER_SITE_OTHER): Payer: 59 | Admitting: Cardiovascular Disease

## 2021-05-23 ENCOUNTER — Encounter (HOSPITAL_BASED_OUTPATIENT_CLINIC_OR_DEPARTMENT_OTHER): Payer: Self-pay | Admitting: Cardiovascular Disease

## 2021-05-23 ENCOUNTER — Other Ambulatory Visit: Payer: Self-pay

## 2021-05-23 DIAGNOSIS — I2510 Atherosclerotic heart disease of native coronary artery without angina pectoris: Secondary | ICD-10-CM

## 2021-05-23 DIAGNOSIS — E7800 Pure hypercholesterolemia, unspecified: Secondary | ICD-10-CM

## 2021-05-23 HISTORY — DX: Atherosclerotic heart disease of native coronary artery without angina pectoris: I25.10

## 2021-05-23 HISTORY — DX: Pure hypercholesterolemia, unspecified: E78.00

## 2021-05-23 NOTE — Assessment & Plan Note (Signed)
Calcium score 57th percentile.  Start low-dose statin if her lipids are not at goal on repeat.

## 2021-05-23 NOTE — Patient Instructions (Signed)
Medication Instructions:  Your physician recommends that you continue on your current medications as directed. Please refer to the Current Medication list given to you today.   Labwork: NONE  Testing/Procedures: NONE  Follow-Up: AS NEEDED   Any Other Special Instructions Will Be Listed Below (If Applicable).  Mediterranean Diet A Mediterranean diet refers to food and lifestyle choices that are based on the traditions of countries located on the [place]. It focuses on eating more fruits, vegetables, whole grains, beans, nuts, seeds, and heart-healthy fats, and eating less dairy, meat, eggs, and processed foods with added sugar, salt, and fat. This way of eating has been shown to help prevent certain conditions and improve outcomes for people who have chronic diseases, like kidney disease and heart disease. What are tips for following this plan? Reading food labels Check the serving size of packaged foods. For foods such as rice and pasta, the serving size refers to the amount of cooked product, not dry. Check the total fat in packaged foods. Avoid foods that have saturated fat or trans fats. Check the ingredient list for added sugars, such as corn syrup. Shopping  Buy a variety of foods that offer a balanced diet, including: Fresh fruits and vegetables (produce). Grains, beans, nuts, and seeds. Some of these may be available in unpackaged forms or large amounts (in bulk). Fresh seafood. Poultry and eggs. Low-fat dairy products. Buy whole ingredients instead of prepackaged foods. Buy fresh fruits and vegetables in-season from local farmers markets. Buy plain frozen fruits and vegetables. If you do not have access to quality fresh seafood, buy precooked frozen shrimp or canned fish, such as tuna, salmon, or sardines. Stock your pantry so you always have certain foods on hand, such as olive oil, canned tuna, canned tomatoes, rice, pasta, and beans. Cooking Cook foods with  extra-virgin olive oil instead of using butter or other vegetable oils. Have meat as a side dish, and have vegetables or grains as your main dish. This means having meat in small portions or adding small amounts of meat to foods like pasta or stew. Use beans or vegetables instead of meat in common dishes like chili or lasagna. Experiment with different cooking methods. Try roasting, broiling, steaming, and sauting vegetables. Add frozen vegetables to soups, stews, pasta, or rice. Add nuts or seeds for added healthy fats and plant protein at each meal. You can add these to yogurt, salads, or vegetable dishes. Marinate fish or vegetables using olive oil, lemon juice, garlic, and fresh herbs. Meal planning Plan to eat one vegetarian meal one day each week. Try to work up to two vegetarian meals, if possible. Eat seafood two or more times a week. Have healthy snacks readily available, such as: Vegetable sticks with hummus. Greek yogurt. Fruit and nut trail mix. Eat balanced meals throughout the week. This includes: Fruit: 2-3 servings a day. Vegetables: 4-5 servings a day. Low-fat dairy: 2 servings a day. Fish, poultry, or lean meat: 1 serving a day. Beans and legumes: 2 or more servings a week. Nuts and seeds: 1-2 servings a day. Whole grains: 6-8 servings a day. Extra-virgin olive oil: 3-4 servings a day. Limit red meat and sweets to only a few servings a month. Lifestyle  Cook and eat meals together with your family, when possible. Drink enough fluid to keep your urine pale yellow. Be physically active every day. This includes: Aerobic exercise like running or swimming. Leisure activities like gardening, walking, or housework. Get 7-8 hours of sleep each night.  If recommended by your health care provider, drink red wine in moderation. This means 1 glass a day for nonpregnant women and 2 glasses a day for men. A glass of wine equals 5 oz (150 mL). What foods should I  eat? Fruits Apples. Apricots. Avocado. Berries. Bananas. Cherries. Dates. Figs. Grapes. Lemons. Melon. Oranges. Peaches. Plums. Pomegranate. Vegetables Artichokes. Beets. Broccoli. Cabbage. Carrots. Eggplant. Green beans. Chard. Kale. Spinach. Onions. Leeks. Peas. Squash. Tomatoes. Peppers. Radishes. Grains Whole-grain pasta. Brown rice. Bulgur wheat. Polenta. Couscous. Whole-wheat bread. Orpah Cobb. Meats and other proteins Beans. Almonds. Sunflower seeds. Pine nuts. Peanuts. Cod. Salmon. Scallops. Shrimp. Tuna. Tilapia. Clams. Oysters. Eggs. Poultry without skin. Dairy Low-fat milk. Cheese. Greek yogurt. Fats and oils Extra-virgin olive oil. Avocado oil. Grapeseed oil. Beverages Water. Red wine. Herbal tea. Sweets and desserts Greek yogurt with honey. Baked apples. Poached pears. Trail mix. Seasonings and condiments Basil. Cilantro. Coriander. Cumin. Mint. Parsley. Sage. Rosemary. Tarragon. Garlic. Oregano. Thyme. Pepper. Balsamic vinegar. Tahini. Hummus. Tomato sauce. Olives. Mushrooms. The items listed above may not be a complete list of foods and beverages you can eat. Contact a dietitian for more information. What foods should I limit? This is a list of foods that should be eaten rarely or only on special occasions. Fruits Fruit canned in syrup. Vegetables Deep-fried potatoes (french fries). Grains Prepackaged pasta or rice dishes. Prepackaged cereal with added sugar. Prepackaged snacks with added sugar. Meats and other proteins Beef. Pork. Lamb. Poultry with skin. Hot dogs. Tomasa Blase. Dairy Ice cream. Sour cream. Whole milk. Fats and oils Butter. Canola oil. Vegetable oil. Beef fat (tallow). Lard. Beverages Juice. Sugar-sweetened soft drinks. Beer. Liquor and spirits. Sweets and desserts Cookies. Cakes. Pies. Candy. Seasonings and condiments Mayonnaise. Pre-made sauces and marinades. The items listed above may not be a complete list of foods and beverages you should  limit. Contact a dietitian for more information. Summary The Mediterranean diet includes both food and lifestyle choices. Eat a variety of fresh fruits and vegetables, beans, nuts, seeds, and whole grains. Limit the amount of red meat and sweets that you eat. If recommended by your health care provider, drink red wine in moderation. This means 1 glass a day for nonpregnant women and 2 glasses a day for men. A glass of wine equals 5 oz (150 mL). This information is not intended to replace advice given to you by your health care provider. Make sure you discuss any questions you have with your health care provider. Document Revised: 08/05/2019 Document Reviewed: 06/02/2019 Elsevier Patient Education  2022 [employer].

## 2021-05-23 NOTE — Progress Notes (Signed)
Cardiology Office Note:    Date:  05/23/2021   ID:  Rachel Velez, DOB [birth year], MRN [id number]  PCP:  Rachel Chimera, MD   [facility] Providers Cardiologist:  Chilton Si, MD     Referring MD: Rachel Chimera, MD   No chief complaint on file.   History of Present Illness:    Rachel Velez is a 65 y.o. female with a hx of arthritis, asthma, and vertigo here for the evaluation of abnormal CT findings.  She had a coronary calcium score of 1.1, which was 57th percentile so she was referred to cardiology. She has a family history of CAD.  Overall she has felt well.  She does not have any  exertional dyspnea or chest pain.  She denies any prior history of tobacco abuse.  She does not get much formal exercise but has no chest pain or shortness of breath when she does exert.  She has a family history of CAD.  Her father had a heart attack at age 40.  She notes that she has struggled with the sweets in her diet.  She does not have any fried or fatty foods.  At home her blood pressure has been in the 120s to 130s over 80s.   Past Medical History:  Diagnosis Date   Arthritis    Asthma    CAD in native artery 05/23/2021   Complication of anesthesia    Environmental allergies    PONV (postoperative nausea and vomiting)    Pure hypercholesterolemia 05/23/2021   Vertigo     Past Surgical History:  Procedure Laterality Date   ABDOMINAL HYSTERECTOMY     CERVICAL CERCLAGE     x2   CESAREAN SECTION     x2   COLONOSCOPY     KNEE ARTHROSCOPY     rt   TOTAL KNEE ARTHROPLASTY Right 01/29/2015   Procedure: RIGHT TOTAL KNEE ARTHROPLASTY;  Surgeon: Ollen Gross, MD;  Location: [facility];  Service: Orthopedics;  Laterality: Right;   TOTAL KNEE ARTHROPLASTY Left 06/11/2015   Procedure: TOTAL LEFT KNEE ARTHROPLASTY;  Surgeon: Ollen Gross, MD;  Location: [facility];  Service: Orthopedics;  Laterality: Left;    Current Medications: Current Meds  Medication Sig   Acetaminophen (TYLENOL  ARTHRITIS PAIN PO) Take 1 tablet by mouth as needed.   CALCIUM-MAGNESIUM-ZINC PO Take 1 tablet by mouth daily.   cetirizine (ZYRTEC ALLERGY) 10 MG tablet Take 10 mg by mouth daily.   Cholecalciferol 1.25 MG (50000 UT) capsule Take 1 capsule by mouth once a week.     Allergies:   Biaxin [clarithromycin], Penicillins, and Sulfa antibiotics   Social History   Socioeconomic History   Marital status: Divorced    Spouse name: Not on file   Number of children: Not on file   Years of education: Not on file   Highest education level: Not on file  Occupational History   Not on file  Tobacco Use   Smoking status: Never   Smokeless tobacco: Never  Substance and Sexual Activity   Alcohol use: Yes    Comment: occasional   Drug use: No   Sexual activity: Not on file  Other Topics Concern   Not on file  Social History Narrative   Not on file   Social Determinants of Health   Financial Resource Strain: Not on file  Food Insecurity: Not on file  Transportation Needs: Not on file  Physical Activity: Not on file  Stress: Not on file  Social  Connections: Not on file     Family History: The patient's family history includes Heart attack (age of onset: 57) in her father.  ROS:   Please see the history of present illness.    (+) All other systems reviewed and are negative.  EKGs/Labs/Other Studies Reviewed:    The following studies were reviewed today:  Cardiac CT with Calcium Score 05/14/2021: FINDINGS: CORONARY CALCIUM SCORES:   Left Main: 0   LAD: 1.1   LCx: 0   RCA: 0   Total Agatston Score: 1.1   MESA database percentile: 57   AORTA MEASUREMENTS:   Ascending Aorta: 33 mm   Descending Aorta: 26 mm   OTHER FINDINGS:   The heart size is within normal limits. No pericardial fluid is identified. Visualized segments of the thoracic aorta and central pulmonary arteries are normal in caliber. Visualized mediastinum and hilar regions demonstrate no lymphadenopathy  or masses. Visualized lungs show no evidence of pulmonary edema, consolidation, pneumothorax, nodule or pleural fluid. Visualized upper abdomen and bony structures are unremarkable.   IMPRESSION: Coronary calcium score of 1.1 is at the 57th percentile for the patient's age, sex and race.  EKG:  05/23/2021: Sinus rhythm. Rate 82 bpm.   Recent Labs: No results found for requested labs within last 8760 hours.  Recent Lipid Panel No results found for: CHOL, TRIG, HDL, CHOLHDL, VLDL, LDLCALC, LDLDIRECT  04/2021: Total cholesterol 214, HDL Triglycerides 177, LDL 132      Physical Exam:     Wt Readings from Last 3 Encounters:  05/23/21 174 lb (78.9 kg)  06/11/15 151 lb (68.5 kg)  06/04/15 151 lb (68.5 kg)     VS:  BP 130/88 (BP Location: Right Arm, Patient Position: Sitting, Cuff Size: Large)   Pulse 82   Ht 5\' 3"  (1.6 m)   Wt 174 lb (78.9 kg)   BMI 30.82 kg/m  , BMI Body mass index is 30.82 kg/m. GENERAL:  Well appearing HEENT: Pupils equal round and reactive, fundi not visualized, oral mucosa unremarkable NECK:  No jugular venous distention, waveform within normal limits, carotid upstroke brisk and symmetric, no bruits, no thyromegaly LUNGS:  Clear to auscultation bilaterally HEART:  RRR.  PMI not displaced or sustained,S1 and S2 within normal limits, no S3, no S4, no clicks, no rubs, no murmurs ABD:  Flat, positive bowel sounds normal in frequency in pitch, no bruits, no rebound, no guarding, no midline pulsatile mass, no hepatomegaly, no splenomegaly EXT:  2 plus pulses throughout, no edema, no cyanosis no clubbing SKIN:  No rashes no nodules NEURO:  Cranial nerves II through XII grossly intact, motor grossly intact throughout PSYCH:  Cognitively intact, oriented to person place and time   ASSESSMENT:    1. CAD in native artery   2. Pure hypercholesterolemia    PLAN:    In order of problems listed above:  CAD in native artery Coronary calcium score was 1.1.   This was roughly 50th percentile for age and gender.  We discussed the importance of maintaining a healthy body weight, limiting fried foods, red meat, and cheese.  We also recommended increasing her exercise to at least 150 minutes.  She is already working on this with her PCP and plans to have repeat lipids in a few months.  If at that time, her LDL is not less than 70 then she should start a statin.`  Pure hypercholesterolemia Calcium score 57th percentile.  Start low-dose statin if her lipids are not at goal  on repeat.     Medication Adjustments/Labs and Tests Ordered: Current medicines are reviewed at length with the patient today.  Concerns regarding medicines are outlined above.  Orders Placed This Encounter  Procedures   EKG 12-Lead   No orders of the defined types were placed in this encounter.   Patient Instructions  Medication Instructions:  Your physician recommends that you continue on your current medications as directed. Please refer to the Current Medication list given to you today.   Labwork: NONE  Testing/Procedures: NONE  Follow-Up: AS NEEDED   Any Other Special Instructions Will Be Listed Below (If Applicable).  Mediterranean Diet A Mediterranean diet refers to food and lifestyle choices that are based on the traditions of countries located on the [place]. It focuses on eating more fruits, vegetables, whole grains, beans, nuts, seeds, and heart-healthy fats, and eating less dairy, meat, eggs, and processed foods with added sugar, salt, and fat. This way of eating has been shown to help prevent certain conditions and improve outcomes for people who have chronic diseases, like kidney disease and heart disease. What are tips for following this plan? Reading food labels Check the serving size of packaged foods. For foods such as rice and pasta, the serving size refers to the amount of cooked product, not dry. Check the total fat in packaged foods. Avoid  foods that have saturated fat or trans fats. Check the ingredient list for added sugars, such as corn syrup. Shopping  Buy a variety of foods that offer a balanced diet, including: Fresh fruits and vegetables (produce). Grains, beans, nuts, and seeds. Some of these may be available in unpackaged forms or large amounts (in bulk). Fresh seafood. Poultry and eggs. Low-fat dairy products. Buy whole ingredients instead of prepackaged foods. Buy fresh fruits and vegetables in-season from local farmers markets. Buy plain frozen fruits and vegetables. If you do not have access to quality fresh seafood, buy precooked frozen shrimp or canned fish, such as tuna, salmon, or sardines. Stock your pantry so you always have certain foods on hand, such as olive oil, canned tuna, canned tomatoes, rice, pasta, and beans. Cooking Cook foods with extra-virgin olive oil instead of using butter or other vegetable oils. Have meat as a side dish, and have vegetables or grains as your main dish. This means having meat in small portions or adding small amounts of meat to foods like pasta or stew. Use beans or vegetables instead of meat in common dishes like chili or lasagna. Experiment with different cooking methods. Try roasting, broiling, steaming, and sauting vegetables. Add frozen vegetables to soups, stews, pasta, or rice. Add nuts or seeds for added healthy fats and plant protein at each meal. You can add these to yogurt, salads, or vegetable dishes. Marinate fish or vegetables using olive oil, lemon juice, garlic, and fresh herbs. Meal planning Plan to eat one vegetarian meal one day each week. Try to work up to two vegetarian meals, if possible. Eat seafood two or more times a week. Have healthy snacks readily available, such as: Vegetable sticks with hummus. Greek yogurt. Fruit and nut trail mix. Eat balanced meals throughout the week. This includes: Fruit: 2-3 servings a day. Vegetables: 4-5 servings  a day. Low-fat dairy: 2 servings a day. Fish, poultry, or lean meat: 1 serving a day. Beans and legumes: 2 or more servings a week. Nuts and seeds: 1-2 servings a day. Whole grains: 6-8 servings a day. Extra-virgin olive oil: 3-4 servings a day. Limit red  meat and sweets to only a few servings a month. Lifestyle  Cook and eat meals together with your family, when possible. Drink enough fluid to keep your urine pale yellow. Be physically active every day. This includes: Aerobic exercise like running or swimming. Leisure activities like gardening, walking, or housework. Get 7-8 hours of sleep each night. If recommended by your health care provider, drink red wine in moderation. This means 1 glass a day for nonpregnant women and 2 glasses a day for men. A glass of wine equals 5 oz (150 mL). What foods should I eat? Fruits Apples. Apricots. Avocado. Berries. Bananas. Cherries. Dates. Figs. Grapes. Lemons. Melon. Oranges. Peaches. Plums. Pomegranate. Vegetables Artichokes. Beets. Broccoli. Cabbage. Carrots. Eggplant. Green beans. Chard. Kale. Spinach. Onions. Leeks. Peas. Squash. Tomatoes. Peppers. Radishes. Grains Whole-grain pasta. Brown rice. Bulgur wheat. Polenta. Couscous. Whole-wheat bread. Orpah Cobb. Meats and other proteins Beans. Almonds. Sunflower seeds. Pine nuts. Peanuts. Cod. Salmon. Scallops. Shrimp. Tuna. Tilapia. Clams. Oysters. Eggs. Poultry without skin. Dairy Low-fat milk. Cheese. Greek yogurt. Fats and oils Extra-virgin olive oil. Avocado oil. Grapeseed oil. Beverages Water. Red wine. Herbal tea. Sweets and desserts Greek yogurt with honey. Baked apples. Poached pears. Trail mix. Seasonings and condiments Basil. Cilantro. Coriander. Cumin. Mint. Parsley. Sage. Rosemary. Tarragon. Garlic. Oregano. Thyme. Pepper. Balsamic vinegar. Tahini. Hummus. Tomato sauce. Olives. Mushrooms. The items listed above may not be a complete list of foods and beverages you can  eat. Contact a dietitian for more information. What foods should I limit? This is a list of foods that should be eaten rarely or only on special occasions. Fruits Fruit canned in syrup. Vegetables Deep-fried potatoes (french fries). Grains Prepackaged pasta or rice dishes. Prepackaged cereal with added sugar. Prepackaged snacks with added sugar. Meats and other proteins Beef. Pork. Lamb. Poultry with skin. Hot dogs. Tomasa Blase. Dairy Ice cream. Sour cream. Whole milk. Fats and oils Butter. Canola oil. Vegetable oil. Beef fat (tallow). Lard. Beverages Juice. Sugar-sweetened soft drinks. Beer. Liquor and spirits. Sweets and desserts Cookies. Cakes. Pies. Candy. Seasonings and condiments Mayonnaise. Pre-made sauces and marinades. The items listed above may not be a complete list of foods and beverages you should limit. Contact a dietitian for more information. Summary The Mediterranean diet includes both food and lifestyle choices. Eat a variety of fresh fruits and vegetables, beans, nuts, seeds, and whole grains. Limit the amount of red meat and sweets that you eat. If recommended by your health care provider, drink red wine in moderation. This means 1 glass a day for nonpregnant women and 2 glasses a day for men. A glass of wine equals 5 oz (150 mL). This information is not intended to replace advice given to you by your health care provider. Make sure you discuss any questions you have with your health care provider. Document Revised: 08/05/2019 Document Reviewed: 06/02/2019 Elsevier Patient Education  2022 [employer]   Disposition: FU with Mattox Schorr C. Duke Salvia, MD, [facility] as needed.  Signed, Chilton Si, MD  05/23/2021 4:34 PM    [facility]

## 2021-05-23 NOTE — Assessment & Plan Note (Signed)
Coronary calcium score was 1.1.  This was roughly 50th percentile for age and gender.  We discussed the importance of maintaining a healthy body weight, limiting fried foods, red meat, and cheese.  We also recommended increasing her exercise to at least 150 minutes.  She is already working on this with her PCP and plans to have repeat lipids in a few months.  If at that time, her LDL is not less than 70 then she should start a statin.`

## 2021-09-03 DIAGNOSIS — L570 Actinic keratosis: Secondary | ICD-10-CM | POA: Diagnosis not present

## 2021-09-03 DIAGNOSIS — L821 Other seborrheic keratosis: Secondary | ICD-10-CM | POA: Diagnosis not present

## 2021-09-03 DIAGNOSIS — D225 Melanocytic nevi of trunk: Secondary | ICD-10-CM | POA: Diagnosis not present

## 2021-09-03 DIAGNOSIS — L814 Other melanin hyperpigmentation: Secondary | ICD-10-CM | POA: Diagnosis not present

## 2021-11-26 DIAGNOSIS — E785 Hyperlipidemia, unspecified: Secondary | ICD-10-CM | POA: Diagnosis not present

## 2022-01-19 IMAGING — CT CT CARDIAC CORONARY ARTERY CALCIUM SCORE
3 series · 14 of 20 positions shown, 16 images · non-contrast
Comparison: None.

CLINICAL DATA: 65-year-old Caucasian female with history of
hyperlipidemia and family history of heart disease.

EXAM:
CT CARDIAC CORONARY ARTERY CALCIUM SCORE
TECHNIQUE: Non-contrast imaging through the heart was performed using
prospective ECG gating. Image post processing was performed on an
independent workstation, allowing for quantitative analysis of the
heart and coronary arteries. Note that this exam targets the heart
and the chest was not imaged in its entirety.

[Series 2: calcium scoring 2.00 qr36 bestdiast 71% hrt calciu · axial · 0.35mm/px · z∈[+1628,+1712]mm · 4 of 70 slices shown]
[im 14/70  vessel]
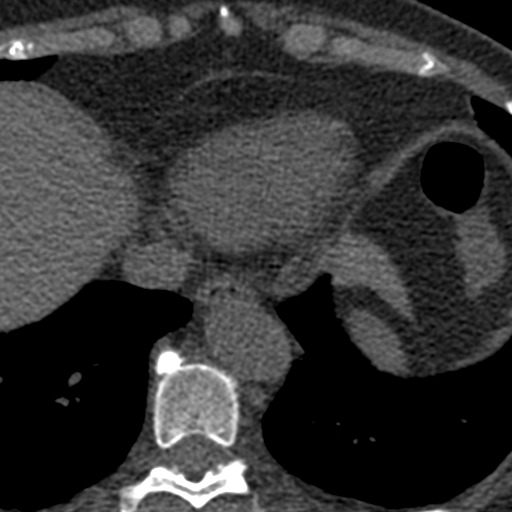
[im 28/70  vessel]
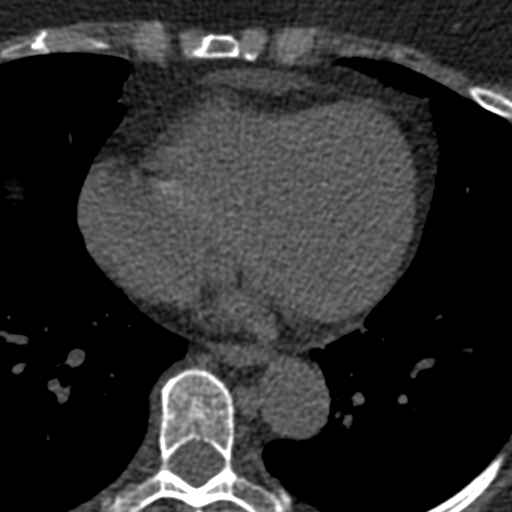
[im 42/70  vessel]
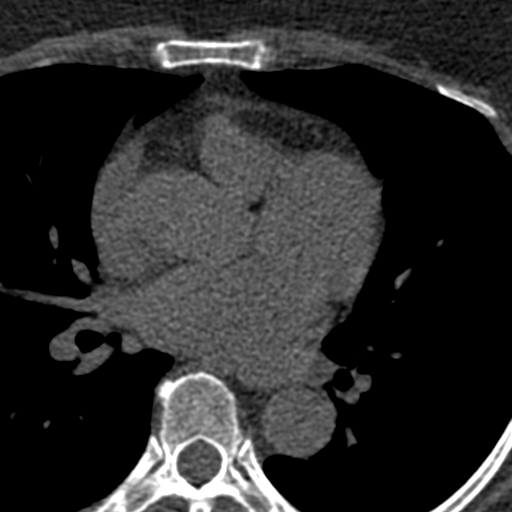
[im 56/70  vessel]
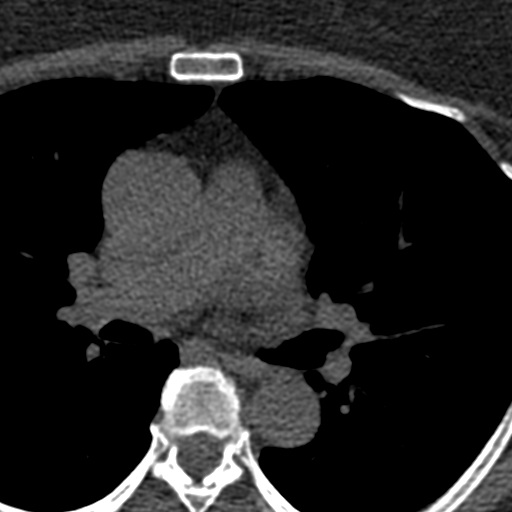

[Series 3: calcium scoring 2.00 br40 bestdiast 71% axial · axial · 0.54mm/px · z∈[+1624,+1716]mm · 5 of 70 slices shown, 7 images]
[im 12/70  vessel]
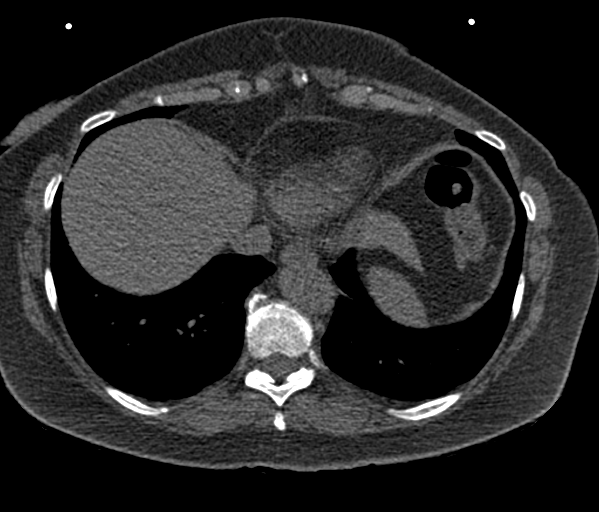
[im 12/70  lung]
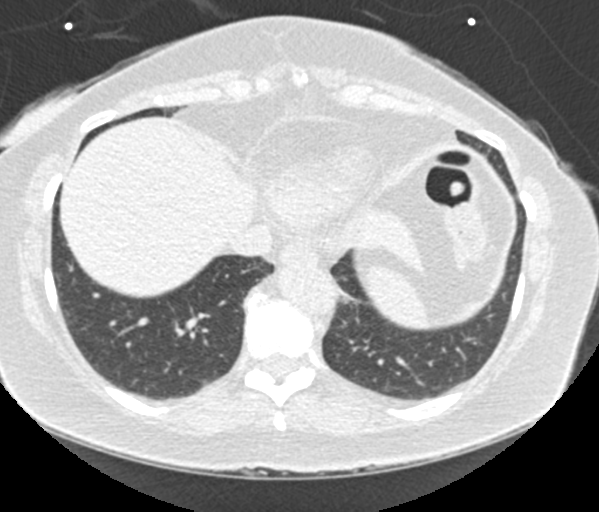
[im 24/70  vessel]
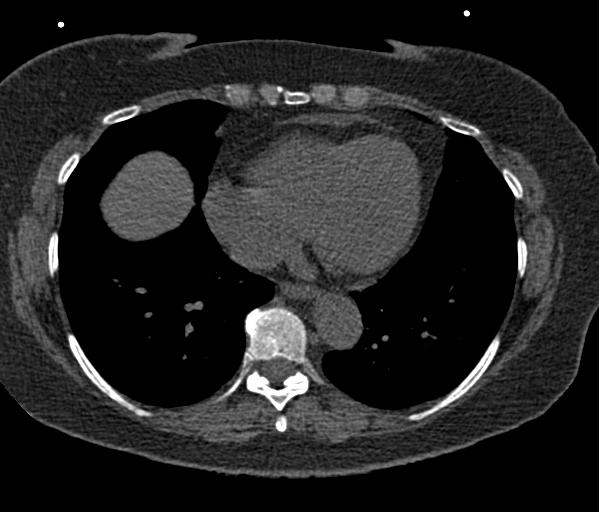
[im 35/70  vessel]
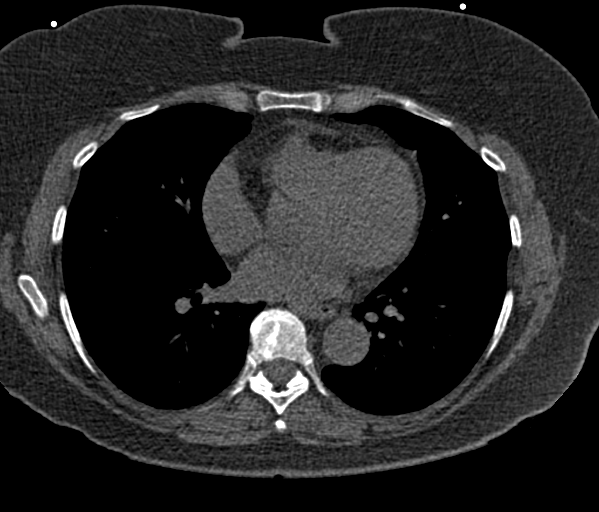
[im 47/70  vessel]
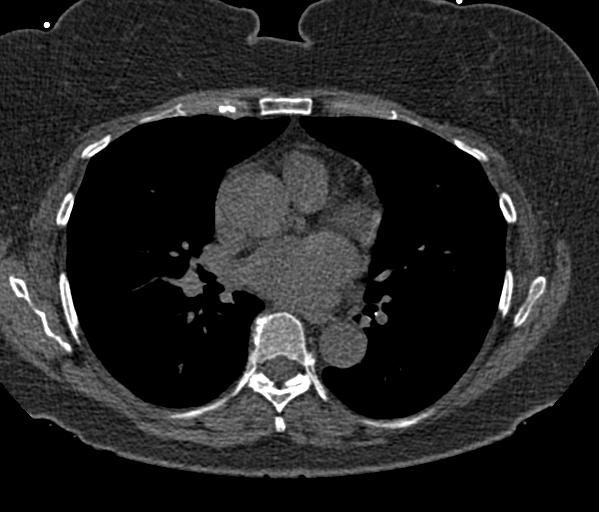
[im 58/70  vessel]
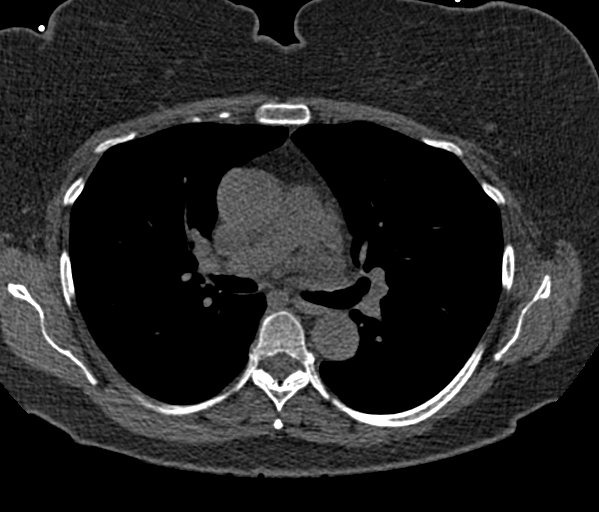
[im 58/70  lung]
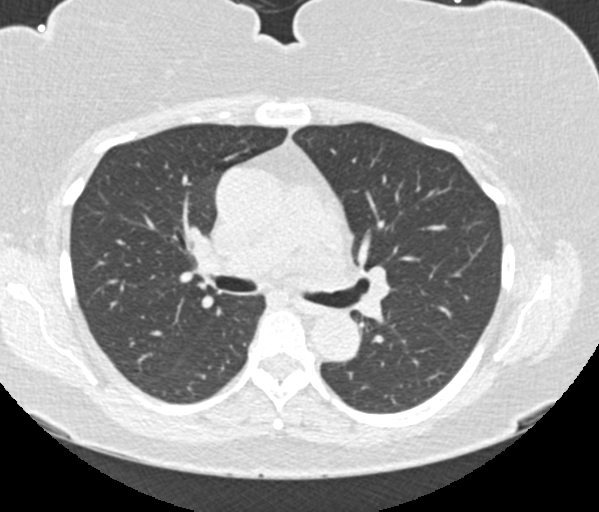

[Series 9: calcium scoring 2.00 br60 bestdiast 71% lungs · axial · 0.54mm/px · z∈[+1624,+1716]mm · 5 of 70 slices shown]
[im 12/70  vessel]
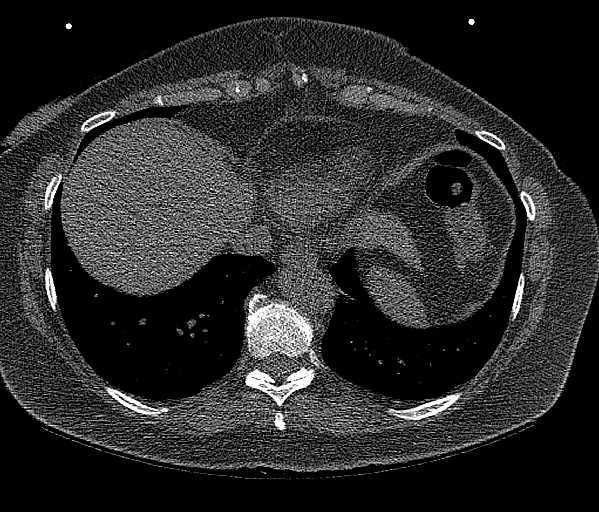
[im 24/70  vessel]
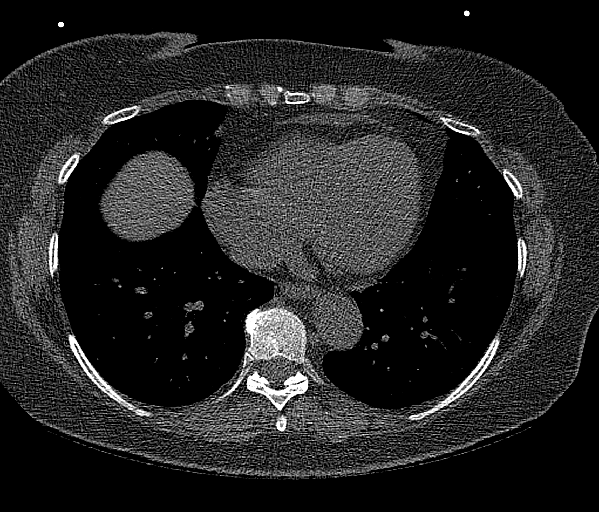
[im 35/70  vessel]
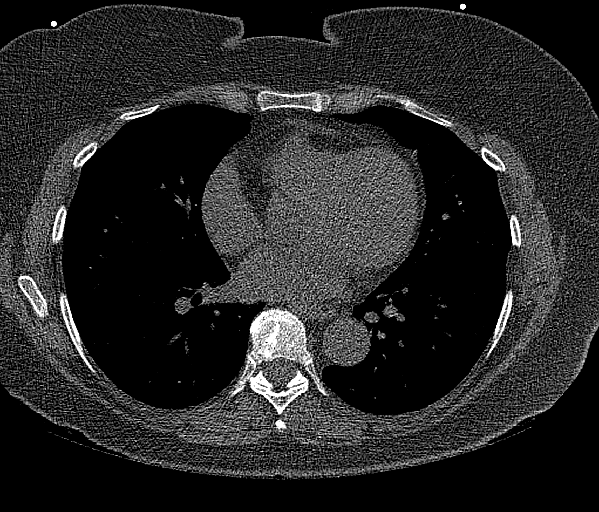
[im 47/70  vessel]
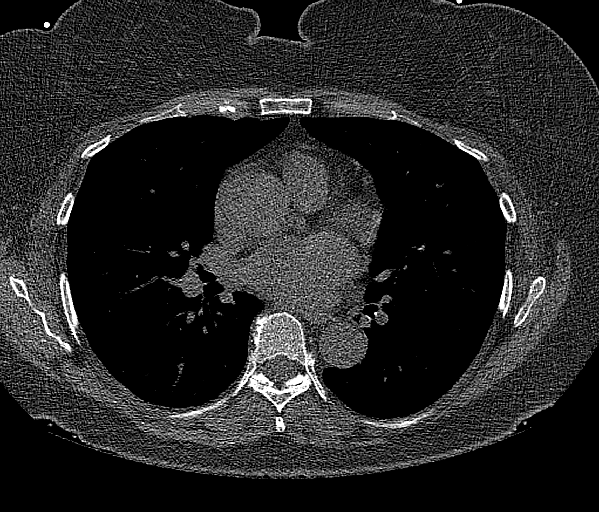
[im 58/70  vessel]
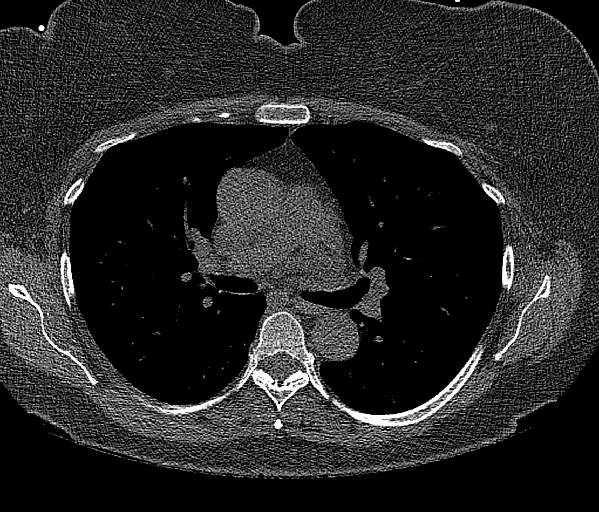

[14 of 20 positions shown; findings below may reference images not displayed]

FINDINGS: CORONARY CALCIUM SCORES:

Left Main: 0

LAD:

LCx: 0

RCA: 0

Total Agatston Score:

[HOSPITAL] percentile: 57

AORTA MEASUREMENTS:

Ascending Aorta: 33 mm

Descending Aorta: 26 mm

OTHER FINDINGS:

The heart size is within normal limits. No pericardial fluid is
identified. Visualized segments of the thoracic aorta and central
pulmonary arteries are normal in caliber. Visualized mediastinum and
hilar regions demonstrate no lymphadenopathy or masses. Visualized
lungs show no evidence of pulmonary edema, consolidation,
pneumothorax, nodule or pleural fluid. Visualized upper abdomen and
bony structures are unremarkable.
IMPRESSION: Coronary calcium score of 1.1 is at the 57th percentile for the
patient's age, sex and race.

## 2022-02-26 ENCOUNTER — Ambulatory Visit: Payer: Self-pay | Admitting: Skilled Nursing Facility1

## 2022-04-23 DIAGNOSIS — Z1321 Encounter for screening for nutritional disorder: Secondary | ICD-10-CM | POA: Diagnosis not present

## 2022-04-23 DIAGNOSIS — Z1272 Encounter for screening for malignant neoplasm of vagina: Secondary | ICD-10-CM | POA: Diagnosis not present

## 2022-04-23 DIAGNOSIS — R69 Illness, unspecified: Secondary | ICD-10-CM | POA: Diagnosis not present

## 2022-04-23 DIAGNOSIS — Z131 Encounter for screening for diabetes mellitus: Secondary | ICD-10-CM | POA: Diagnosis not present

## 2022-04-23 DIAGNOSIS — Z01419 Encounter for gynecological examination (general) (routine) without abnormal findings: Secondary | ICD-10-CM | POA: Diagnosis not present

## 2022-04-23 DIAGNOSIS — Z13220 Encounter for screening for lipoid disorders: Secondary | ICD-10-CM | POA: Diagnosis not present

## 2022-04-23 DIAGNOSIS — Z6830 Body mass index (BMI) 30.0-30.9, adult: Secondary | ICD-10-CM | POA: Diagnosis not present

## 2022-04-23 DIAGNOSIS — Z1231 Encounter for screening mammogram for malignant neoplasm of breast: Secondary | ICD-10-CM | POA: Diagnosis not present

## 2022-04-23 DIAGNOSIS — Z1329 Encounter for screening for other suspected endocrine disorder: Secondary | ICD-10-CM | POA: Diagnosis not present

## 2022-04-23 DIAGNOSIS — Z13228 Encounter for screening for other metabolic disorders: Secondary | ICD-10-CM | POA: Diagnosis not present

## 2022-04-29 DIAGNOSIS — Z23 Encounter for immunization: Secondary | ICD-10-CM | POA: Diagnosis not present

## 2022-06-09 ENCOUNTER — Ambulatory Visit (HOSPITAL_BASED_OUTPATIENT_CLINIC_OR_DEPARTMENT_OTHER): Payer: Self-pay | Admitting: Family

## 2022-08-19 ENCOUNTER — Ambulatory Visit (HOSPITAL_BASED_OUTPATIENT_CLINIC_OR_DEPARTMENT_OTHER): Payer: Self-pay | Admitting: Cardiovascular Disease

## 2022-10-16 ENCOUNTER — Ambulatory Visit (HOSPITAL_BASED_OUTPATIENT_CLINIC_OR_DEPARTMENT_OTHER): Payer: Self-pay | Admitting: Cardiovascular Disease

## 2022-11-16 NOTE — Progress Notes (Signed)
Office Visit Note  Patient: Rachel Velez             Date of Birth: 1956/05/17           MRN: [id number]             PCP: Richardean Chimera, MD Referring: Richardean Chimera, MD Visit Date: 11/17/2022 Occupation: Retired Nurse, mental health  Subjective:  New Patient (Initial Visit) (Patient states her fingers are getting worse and harder to use. Patient states she has a lump on the top of her foot that hurts sometimes. )   History of Present Illness: Rachel Velez is a 67 y.o. female here for evaluation of arthritis with gradual progression of finger joint nodules, stiffness, and decreased range of motion. Problem has been gradually progressive for years. She had remote fracture and jamming of her right 3rd finger decades ago no other hand injuries or surgery. Also has some swelling on the top of her left foot this is more recent. This is painful on some days but not all the time and not significantly affecting her ability to walk. She had bilateral knee replacement in 2016 for osteoarthritis with excellent outcome and no ongoing knee pain now. Her symptoms are manageable and takes tylenol as needed such as when going fishing but not every day. She wears compressive gloves part of the time in mornings that seems to help with pain and with swelling especially in her small finger. She has a family history of CAD with father deceased at age 58. Personal history is never smoker, coronary calcium score of 1 and cholesterol in normal range but has been recommended possible statin therapy due to risk factors.   Activities of Daily Living:  Patient reports morning stiffness for 5 minutes.   Patient Reports nocturnal pain.  Difficulty dressing/grooming: Denies Difficulty climbing stairs: Denies Difficulty getting out of chair: Denies Difficulty using hands for taps, buttons, cutlery, and/or writing: Reports  Review of Systems  Constitutional:  Negative for fatigue.  HENT:  Positive for mouth dryness.  Negative for mouth sores.   Eyes:  Negative for dryness.  Respiratory:  Negative for shortness of breath.   Cardiovascular:  Negative for chest pain and palpitations.  Gastrointestinal:  Negative for blood in stool, constipation and diarrhea.  Endocrine: Negative for increased urination.  Genitourinary:  Negative for involuntary urination.  Musculoskeletal:  Positive for joint pain, gait problem, joint pain, joint swelling and morning stiffness. Negative for myalgias, muscle weakness, muscle tenderness and myalgias.  Skin:  Positive for rash. Negative for color change, hair loss and sensitivity to sunlight.  Allergic/Immunologic: Negative for susceptible to infections.  Neurological:  Positive for dizziness. Negative for headaches.  Hematological:  Negative for swollen glands.  Psychiatric/Behavioral:  Negative for depressed mood and sleep disturbance. The patient is not nervous/anxious.     PMFS History:  Patient Active Problem List   Diagnosis Date Noted   Bilateral hand pain 11/17/2022   CAD in native artery 05/23/2021   Pure hypercholesterolemia 05/23/2021   Allergic rhinitis 02/11/2021   Status post total bilateral knee replacement 01/26/2020   Hearing loss 02/03/2019   BPPV (benign paroxysmal positional vertigo) 03/30/2017   OA (osteoarthritis) of knee 01/29/2015    Past Medical History:  Diagnosis Date   Arthritis    Asthma    CAD in native artery 05/23/2021   Complication of anesthesia    Environmental allergies    PONV (postoperative nausea and vomiting)    Pure hypercholesterolemia 05/23/2021  Vertigo     Family History  Problem Relation Age of Onset   Heart attack Father 63   Diabetes Brother    Prostate cancer Brother    Past Surgical History:  Procedure Laterality Date   ABDOMINAL HYSTERECTOMY     CERVICAL CERCLAGE     x2   CESAREAN SECTION     x2   COLONOSCOPY     KNEE ARTHROSCOPY     rt   TOTAL KNEE ARTHROPLASTY Right 01/29/2015   Procedure: RIGHT  TOTAL KNEE ARTHROPLASTY;  Surgeon: Ollen Gross, MD;  Location: [facility];  Service: Orthopedics;  Laterality: Right;   TOTAL KNEE ARTHROPLASTY Left 06/11/2015   Procedure: TOTAL LEFT KNEE ARTHROPLASTY;  Surgeon: Ollen Gross, MD;  Location: [facility];  Service: Orthopedics;  Laterality: Left;   Social History   Social History Narrative   Not on file   Immunization History  Administered Date(s) Administered   Influenza Inj Mdck Quad Pf 04/29/2022   Influenza Split 06/07/2014   Influenza,inj,Quad PF,6+ Mos 05/11/2017, 05/08/2018   Influenza,trivalent, recombinat, inj, PF 06/07/2014, 05/09/2015, 05/11/2017, 05/09/2018   Influenza-Unspecified 06/07/2014, 05/09/2015, 05/11/2017, 05/11/2017, 05/08/2018, 05/09/2018, 04/24/2021, 04/29/2022   PFIZER(Purple Top)SARS-COV-2 Vaccination 07/31/2019, 08/18/2019, 05/20/2021   Tdap 07/15/2019   Unspecified SARS-COV-2 Vaccination 07/14/2020   Zoster Recombinat (Shingrix) 12/14/2017, 03/08/2018   Zoster, Live 07/15/2019     Objective: Vital Signs: BP (!) 145/89 (BP Location: Right Arm, Patient Position: Sitting, Cuff Size: Normal)   Pulse 90   Resp 12   Ht 5' 2.25" (1.581 m)   Wt 174 lb (78.9 kg)   BMI 31.57 kg/m    Physical Exam Cardiovascular:     Rate and Rhythm: Normal rate and regular rhythm.  Pulmonary:     Effort: Pulmonary effort is normal.     Breath sounds: Normal breath sounds.  Skin:    General: Skin is warm and dry.     Findings: No rash.  Neurological:     Mental Status: She is alert.  Psychiatric:        Mood and Affect: Mood normal.      Musculoskeletal Exam:  Shoulders full ROM no tenderness or swelling Elbows full ROM no tenderness or swelling Wrists full ROM no tenderness or swelling Fingers heberdon's nodes b/l, right hand grip ROM limited about 70% and mild lateral deviation in 2nd and 3rd fingers Hip normal internal and external rotation without pain, no tenderness to lateral hip palpation Knees full ROM no  tenderness or swelling Ankles full ROM left foot with firm nontender nodule on dorsum   Investigation: No additional findings.  Imaging: No results found.  Recent Labs: Lab Results  Component Value Date   WBC 9.3 06/13/2015   HGB 10.1 (L) 06/13/2015   PLT 190 06/13/2015   NA 139 06/13/2015   K 3.8 06/13/2015   CL 104 06/13/2015   CO2 29 06/13/2015   GLUCOSE 101 (H) 06/13/2015   BUN 9 06/13/2015   CREATININE 0.56 06/13/2015   BILITOT 0.7 06/04/2015   ALKPHOS 48 06/04/2015   AST 21 06/04/2015   ALT 16 06/04/2015   PROT 7.3 06/04/2015   ALBUMIN 4.4 06/04/2015   CALCIUM 8.8 (L) 06/13/2015   GFRAA >60 06/13/2015    Speciality Comments: No specialty comments available.  Procedures:  No procedures performed Allergies: Biaxin [clarithromycin], Penicillins, and Sulfa antibiotics   Assessment / Plan:     Visit Diagnoses: Bilateral hand pain - Plan: XR Hand 2 View Right, XR Hand 2 View Left  Pain in bilateral hands no appreciable synovitis on exam including limited ultrasound inspection and review hand x-rays in clinic today showing very extensive PIP and DIP joint degenerative disease.  Questionable for 1 joint could be consistent with erosive osteoarthritis but overall do not see evidence for inflammatory process.  We discussed these and possible treatment options for hand osteoarthritis including oral anti-inflammatory medications, topical therapy, supplements, and bracing or heat treatment as needed.  No specific area in bad enough exacerbation right now to recommend intra-articular steroid injections.  Orders: Orders Placed This Encounter  Procedures   XR Hand 2 View Right   XR Hand 2 View Left   No orders of the defined types were placed in this encounter.   Follow-Up Instructions: Return if symptoms worsen or fail to improve, for Osteoarthritis.   Fuller Plan, MD  Note - This record has been created using [employer].  Chart creation errors have been  sought, but may not always  have been located. Such creation errors do not reflect on  the standard of medical care.

## 2022-11-17 ENCOUNTER — Ambulatory Visit: Payer: Medicare HMO

## 2022-11-17 ENCOUNTER — Ambulatory Visit (INDEPENDENT_AMBULATORY_CARE_PROVIDER_SITE_OTHER): Payer: Medicare HMO

## 2022-11-17 ENCOUNTER — Encounter: Payer: Self-pay | Admitting: Internal Medicine

## 2022-11-17 ENCOUNTER — Ambulatory Visit: Payer: Medicare HMO | Attending: Internal Medicine | Admitting: Internal Medicine

## 2022-11-17 VITALS — BP 145/89 | HR 90 | Resp 12 | Ht 62.25 in | Wt 174.0 lb

## 2022-11-17 DIAGNOSIS — M79641 Pain in right hand: Secondary | ICD-10-CM | POA: Diagnosis not present

## 2022-11-17 DIAGNOSIS — M79642 Pain in left hand: Secondary | ICD-10-CM

## 2022-11-17 NOTE — Patient Instructions (Signed)

## 2023-01-20 ENCOUNTER — Ambulatory Visit (HOSPITAL_BASED_OUTPATIENT_CLINIC_OR_DEPARTMENT_OTHER): Payer: Medicare HMO | Admitting: Cardiovascular Disease

## 2023-01-20 ENCOUNTER — Encounter (HOSPITAL_BASED_OUTPATIENT_CLINIC_OR_DEPARTMENT_OTHER): Payer: Self-pay | Admitting: *Deleted

## 2023-01-20 ENCOUNTER — Encounter (HOSPITAL_BASED_OUTPATIENT_CLINIC_OR_DEPARTMENT_OTHER): Payer: Self-pay | Admitting: Cardiovascular Disease

## 2023-01-20 VITALS — BP 128/80 | HR 77 | Ht 62.0 in | Wt 174.2 lb

## 2023-01-20 DIAGNOSIS — I2510 Atherosclerotic heart disease of native coronary artery without angina pectoris: Secondary | ICD-10-CM | POA: Diagnosis not present

## 2023-01-20 DIAGNOSIS — E7800 Pure hypercholesterolemia, unspecified: Secondary | ICD-10-CM

## 2023-01-20 DIAGNOSIS — Z5181 Encounter for therapeutic drug level monitoring: Secondary | ICD-10-CM

## 2023-01-20 MED ORDER — ROSUVASTATIN CALCIUM 10 MG PO TABS: 10.0000 mg | ORAL_TABLET | Freq: Every day | ORAL | 3 refills | Status: DC

## 2023-01-20 NOTE — Patient Instructions (Addendum)
Medication Instructions:  START ROSUVASTATIN 10 MG DAILY   *If you need a refill on your cardiac medications before your next appointment, please call your pharmacy*  Lab Work: FASTING LP/CMET END OF SEPTEMBER   If you have labs (blood work) drawn today and your tests are completely normal, you will receive your results only by: MyChart Message (if you have MyChart) OR A paper copy in the mail If you have any lab test that is abnormal or we need to change your treatment, we will call you to review the results.  Testing/Procedures: NONE  Follow-Up: At [facility], you and your health needs are our priority.  As part of our continuing mission to provide you with exceptional heart care, we have created designated Provider Care Teams.  These Care Teams include your primary Cardiologist (physician) and Advanced Practice Providers (APPs -  Physician Assistants and Nurse Practitioners) who all work together to provide you with the care you need, when you need it.  We recommend signing up for the patient portal called "MyChart".  Sign up information is provided on this After Visit Summary.  MyChart is used to connect with patients for Virtual Visits (Telemedicine).  Patients are able to view lab/test results, encounter notes, upcoming appointments, etc.  Non-urgent messages can be sent to your provider as well.   To learn more about what you can do with MyChart, go to [email or web address].    Your next appointment:   12 month(s)  Provider:   Chilton Si, MD or Gillian Shields, NP

## 2023-01-20 NOTE — Telephone Encounter (Signed)
This encounter was created in error - please disregard.

## 2023-01-20 NOTE — Progress Notes (Signed)
Cardiology Office Note:  .    Date:  01/20/2023  ID:  Rachel, Velez Jun 23, 1956, MRN [id number] PCP: Barbie Banner, MD  [facility] Providers Cardiologist:  Chilton Si, MD     History of Present Illness: .    Rachel Velez is a 67 y.o. female with a hx of coronary calcification, arthritis, asthma, and vertigo, who presents for follow-up today. She was initially seen 05/23/2021 for the evaluation of abnormal CT findings.  She had a coronary calcium score of 1.1, which was 57th percentile (05/2021) so she was referred to cardiology. She has a family history of CAD. Her father had a heart attack at age 48.   At her initial visit, she was generally feeling well and denied any exertional symptoms. Home blood pressures averaged 120s-130s/80s. We discussed the importance of maintaining a healthy body weight, limiting fried foods, red meat, and cheese. We also recommended increasing her exercise to at least 150 minutes. Planned for initiation of statin therapy if repeat lipid panel shows elevated LDL.  Today, she states that her main complaint is arthritic pain in her hands and feet mostly. She has been seen by her rheumatologist and was recommended to try fish oil and turmeric. Generally she goes on walks 3-5 times a week, with a goal of at least 5,000 steps each time. Often achieves 10-12,000 steps. Last week she had been unable to walk at all given the extreme heat. Her knee pain and arthritis limits her prior to experiencing any chest pain or shortness of breath. Sometimes she notices mild puffiness in her ankles. In the office today her blood pressure is 132/90; she endorses a component of white coat hypertension.  On manual recheck, her BP improved to 128/80 which she states is more normal for her. She notes that her systolic blood pressures will vary depending on the time of day: this morning it was 119 and later in the day it may be up into the 130s. Last Fall, her LDL was 125.  Typically she prepares half of her meals at home and orders out for the rest. She doesn't care for dairy products but does admit to occasional fried foods and red meats. Additionally, she complains of hot flashes. 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 She denies any palpitations, chest pain, shortness of breath, lightheadedness, headaches, syncope, orthopnea, or PND.  ROS:  Please see the history of present illness. All other systems are reviewed and negative.  (+) Arthralgias (+) Mild ankle swelling (+) Hot flashes  Studies Reviewed: .       01/20/23: EKG sinus rhythm.  Rate 77 bpm.  PVC.  Low voltage.  Non-specific T wave abnormality. Risk Assessment/Calculations:             Physical Exam:    VS:  BP 128/80 (BP Location: Right Arm, Patient Position: Sitting, Cuff Size: Normal)   Pulse 77   Ht 5\' 2"  (1.575 m)   Wt 174 lb 3.2 oz (79 kg)   BMI 31.86 kg/m  , BMI Body mass index is 31.86 kg/m. GENERAL:  Well appearing HEENT: Pupils equal round and reactive, fundi not visualized, oral mucosa unremarkable NECK:  No jugular venous distention, waveform within normal limits, carotid upstroke brisk and symmetric, no bruits, no thyromegaly LUNGS:  Clear to auscultation bilaterally HEART:  RRR.  PMI not displaced or sustained,S1 and S2 within normal limits, no S3, no S4, no clicks, no rubs,  no murmurs ABD:  Flat, positive bowel sounds normal in frequency in pitch, no bruits, no rebound, no guarding, no midline pulsatile mass, no hepatomegaly, no splenomegaly EXT:  2 plus pulses throughout, no edema, no cyanosis no clubbing SKIN:  No rashes no nodules NEURO:  Cranial nerves II through XII grossly intact, motor grossly intact throughout PSYCH:  Cognitively intact, oriented to person place and time  Wt Readings from Last 3 Encounters:  01/20/23 174 lb 3.2 oz (79 kg)  11/17/22 174 lb (78.9 kg)  05/23/21 174 lb (78.9 kg)      ASSESSMENT AND PLAN: .    # Coronary calcification: # Hyperlipidemia: LDL last checked in fall was 125, goal is to reduce to 70 or lower. Patient has a history of coronary calcification. -Start rosuvastatin 10mg  daily. -Check CMP and lipid panel in late September.  # Exercise and Diet: Patient walks regularly, aiming for 5,000 steps daily. Diet could be improved, patient consumes some red meat and fried foods. -Encourage consistent exercise and a diet low in animal products, fried foods, and sodium.  Follow-up: -Plan to check in one year, or sooner if patient needs.             Dispo:  FU with Song Myre C. Duke Salvia, MD, [facility] in 1 year.  I,Mathew Stumpf,acting as a Neurosurgeon for Chilton Si, MD.,have documented all relevant documentation on the behalf of Chilton Si, MD,as directed by  Chilton Si, MD while in the presence of Chilton Si, MD.  I, Mico Spark C. Duke Salvia, MD have reviewed all documentation for this visit.  The documentation of the exam, diagnosis, procedures, and orders on 01/20/2023 are all accurate and complete.   Signed, Chilton Si, MD

## 2023-04-03 ENCOUNTER — Encounter (HOSPITAL_BASED_OUTPATIENT_CLINIC_OR_DEPARTMENT_OTHER): Payer: Self-pay | Admitting: Cardiovascular Disease

## 2023-04-03 NOTE — Telephone Encounter (Signed)
She can hold statin x 2 weeks and then send [place] a MyChart to let us know if pain is better.  Alver Sorrow, NP

## 2023-04-07 LAB — COMPREHENSIVE METABOLIC PANEL
ALT: 19 IU/L (ref 0–32)
AST: 19 IU/L (ref 0–40)
Albumin: 4.5 g/dL (ref 3.9–4.9)
Alkaline Phosphatase: 68 IU/L (ref 44–121)
BUN/Creatinine Ratio: 14 (ref 12–28)
BUN: 12 mg/dL (ref 8–27)
Bilirubin Total: 0.4 mg/dL (ref 0.0–1.2)
CO2: 23 mmol/L (ref 20–29)
Calcium: 9.5 mg/dL (ref 8.7–10.3)
Chloride: 105 mmol/L (ref 96–106)
Creatinine, Ser: 0.86 mg/dL (ref 0.57–1.00)
Globulin, Total: 2.3 g/dL (ref 1.5–4.5)
Glucose: 91 mg/dL (ref 70–99)
Potassium: 4.3 mmol/L (ref 3.5–5.2)
Sodium: 142 mmol/L (ref 134–144)
Total Protein: 6.8 g/dL (ref 6.0–8.5)
eGFR: 75 mL/min/{1.73_m2} (ref >59)

## 2023-04-07 LAB — LIPID PANEL
Chol/HDL Ratio: 2.4 ratio (ref 0.0–4.4)
Cholesterol, Total: 153 mg/dL (ref 100–199)
HDL: 64 mg/dL (ref >39)
LDL Chol Calc (NIH): 66 mg/dL (ref 0–99)
Triglycerides: 131 mg/dL (ref 0–149)
VLDL Cholesterol Cal: 23 mg/dL (ref 5–40)

## 2023-04-21 MED ORDER — PRAVASTATIN SODIUM 20 MG PO TABS: 20.0000 mg | ORAL_TABLET | Freq: Every evening | ORAL | 3 refills | Status: DC

## 2023-04-21 NOTE — Telephone Encounter (Signed)
Please advise for alternative statin based on updated lab results

## 2023-05-08 NOTE — Telephone Encounter (Signed)
Please review and advise.

## 2023-05-15 MED ORDER — EZETIMIBE 10 MG PO TABS: 10.0000 mg | ORAL_TABLET | Freq: Every day | ORAL | 11 refills | Status: DC

## 2023-05-15 NOTE — Addendum Note (Signed)
Addended by: Marlene Lard on: 05/15/2023 11:44 AM   Modules accepted: Orders

## 2024-01-20 ENCOUNTER — Encounter: Payer: Self-pay | Admitting: *Deleted

## 2024-01-22 ENCOUNTER — Ambulatory Visit (HOSPITAL_BASED_OUTPATIENT_CLINIC_OR_DEPARTMENT_OTHER): Admitting: Cardiovascular Disease

## 2024-01-22 ENCOUNTER — Encounter (HOSPITAL_BASED_OUTPATIENT_CLINIC_OR_DEPARTMENT_OTHER): Payer: Self-pay | Admitting: Cardiovascular Disease

## 2024-01-22 VITALS — BP 126/78 | HR 73 | Ht 62.0 in | Wt 177.0 lb

## 2024-01-22 DIAGNOSIS — R030 Elevated blood-pressure reading, without diagnosis of hypertension: Secondary | ICD-10-CM

## 2024-01-22 DIAGNOSIS — Z6832 Body mass index (BMI) 32.0-32.9, adult: Secondary | ICD-10-CM

## 2024-01-22 DIAGNOSIS — E7800 Pure hypercholesterolemia, unspecified: Secondary | ICD-10-CM

## 2024-01-22 NOTE — Progress Notes (Signed)
 Cardiology Office Note:  .   Date:  01/22/2024  ID:  Rachel Velez, DOB [birth year], MRN [id number] PCP: Tanda Prentice DEL, MD  [facility] Providers Cardiologist:  Annabella Scarce, MD    History of Present Illness: .   Rachel Velez is a 68 y.o. female with a hx of coronary calcification, arthritis, asthma, and vertigo, who presents for follow-up today. She was initially seen 05/23/2021 for the evaluation of abnormal CT findings.  She had a coronary calcium  score of 1.1, which was 57th percentile (05/2021) so she was referred to cardiology. She has a family history of CAD. Her father had a heart attack at age 55.  At her initial visit she was feeling well and had no symptoms.  She was started on rosuvastatin  and encouraged exercise.  She did not tolerate rosuvastatin  or pravastatin .  She was switched to Zetia .      Discussed the use of AI scribe software for clinical note transcription with the patient, who gave verbal consent to proceed.  History of Present Illness Rachel Velez experiences elevated blood pressure readings at the doctor's office, which she attributes to nervousness. At home, her blood pressure ranges between 110-130/70-80 mmHg, averaging around 120/70-80 mmHg. Her blood pressure tends to be higher on days when she consumes more caffeine.  She is experiencing significant hot flashes, described as 'feeling flushed' and 'beaten up', which impact her daily comfort.  She has a history of bilateral knee replacements in 2016 and experiences joint pain and arthritis, affecting her physical activity. Despite this, she engages in home exercises, including stretches and deep knee bends, and walks 2-3 miles two to three times a week with a friend, although the heat has recently limited her outdoor activity.  She struggles with weight management, aiming to reach 150 pounds but currently maintaining around 170 pounds. Her diet includes nuts and attempts at a Mediterranean style, with room  for improvement in reducing carbohydrate intake.  She is currently taking Zetia  (ezetimibe ) every other day for cholesterol management, as she experienced issues with statins. She is aware of her family history of cardiovascular issues.  No breathing issues or chest pain during physical activity. Reports excessive sweating, which is typical for her.  ROS:  As per HPI  Studies Reviewed: SABRA   EKG Interpretation Date/Time:  Friday January 22 2024 08:06:26 EDT Ventricular Rate:  73 PR Interval:  164 QRS Duration:  80 QT Interval:  398 QTC Calculation: 438 R Axis:   36  Text Interpretation: Sinus rhythm with occasional Premature ventricular complexes When compared with ECG of 20-Jan-2023 08:24, Nonspecific T wave abnormality, improved in Lateral leads Confirmed by Scarce Annabella ([id number]) on 01/22/2024 8:09:00 AM     Risk Assessment/Calculations:             Physical Exam:   VS:  BP 126/78   Pulse 73   Ht 5' 2 (1.575 m)   Wt 177 lb (80.3 kg)   SpO2 97%   BMI 32.37 kg/m  , BMI Body mass index is 32.37 kg/m. GENERAL:  Well appearing HEENT: Pupils equal round and reactive, fundi not visualized, oral mucosa unremarkable NECK:  No jugular venous distention, waveform within normal limits, carotid upstroke brisk and symmetric, no bruits, no thyromegaly LUNGS:  Clear to auscultation bilaterally HEART:  RRR.  PMI not displaced or sustained,S1 and S2 within normal limits, no S3, no S4, no clicks, no rubs, no murmurs ABD:  Flat, positive bowel sounds normal in frequency in  pitch, no bruits, no rebound, no guarding, no midline pulsatile mass, no hepatomegaly, no splenomegaly EXT:  2 plus pulses throughout, no edema, no cyanosis no clubbing SKIN:  No rashes no nodules NEURO:  Cranial nerves II through XII grossly intact, motor grossly intact throughout PSYCH:  Cognitively intact, oriented to person place and time   ASSESSMENT AND PLAN: .    Assessment & Plan # Elevated BP:  She seems to  have white coat HTN.  Blood pressure spikes due to anxiety before medical appointments; home readings within target range. Caffeine may elevate readings. - Monitor blood pressure at home regularly. - Reduce caffeine intake.  # Hyperlipidemia On ezetimibe  every other day due to side effect concerns. Aggressive lipid management needed due to family history. LDL goal <70 mg/dL. - Order lipid panel and comprehensive metabolic panel. - Adjust ezetimibe  dosage based on lab results.  # Obesity Weight management challenging; her weight goal is 150 lbs. Limited exercise due to heat and joint issues. Diet needs improvement, focus on reducing carbohydrates. - Refer to [facility] program for dietary and psychological support. - Encourage regular exercise, considering indoor options during hot weather.          Dispo: f/u 1 year  Signed, Annabella Scarce, MD

## 2024-01-22 NOTE — Addendum Note (Signed)
 Addended by: FREDIRICK BEAU B on: 01/22/2024 03:49 PM   Modules accepted: Orders

## 2024-01-22 NOTE — Patient Instructions (Signed)
 Medication Instructions:   Your physician recommends that you continue on your current medications as directed. Please refer to the Current Medication list given to you today.   *If you need a refill on your cardiac medications before your next appointment, please call your pharmacy*  Lab Work:  TODAY!!!! LIPID/CMET  If you have labs (blood work) drawn today and your tests are completely normal, you will receive your results only by: MyChart Message (if you have MyChart) OR A paper copy in the mail If you have any lab test that is abnormal or we need to change your treatment, we will call you to review the results.  Testing/Procedures:  None ordered/.  Follow-Up: At [facility], you and your health needs are our priority.  As part of our continuing mission to provide you with exceptional heart care, our providers are all part of one team.  This team includes your primary Cardiologist (physician) and Advanced Practice Providers or APPs (Physician Assistants and Nurse Practitioners) who all work together to provide you with the care you need, when you need it.  Your next appointment:   1 year(s)  Provider:   Annabella Scarce, MD    We recommend signing up for the patient portal called MyChart.  Sign up information is provided on this After Visit Summary.  MyChart is used to connect with patients for Virtual Visits (Telemedicine).  Patients are able to view lab/test results, encounter notes, upcoming appointments, etc.  Non-urgent messages can be sent to your provider as well.   To learn more about what you can do with MyChart, go to [email or web address].   Other Instructions  You have been referred to [facility].   Your physician wants you to follow-up in: 1 year.  You will receive a reminder letter in the mail two months in advance. If you don't receive a letter, please call our office to schedule the follow-up appointment.

## 2024-01-23 ENCOUNTER — Encounter (HOSPITAL_BASED_OUTPATIENT_CLINIC_OR_DEPARTMENT_OTHER): Payer: Self-pay | Admitting: Cardiovascular Disease

## 2024-01-23 LAB — COMPREHENSIVE METABOLIC PANEL WITH GFR
ALT: 22 IU/L (ref 0–32)
AST: 24 IU/L (ref 0–40)
Albumin: 4.3 g/dL (ref 3.9–4.9)
Alkaline Phosphatase: 74 IU/L (ref 44–121)
BUN/Creatinine Ratio: 14 (ref 12–28)
BUN: 12 mg/dL (ref 8–27)
Bilirubin Total: 0.4 mg/dL (ref 0.0–1.2)
CO2: 21 mmol/L (ref 20–29)
Calcium: 9.4 mg/dL (ref 8.7–10.3)
Chloride: 103 mmol/L (ref 96–106)
Creatinine, Ser: 0.86 mg/dL (ref 0.57–1.00)
Globulin, Total: 2.7 g/dL (ref 1.5–4.5)
Glucose: 84 mg/dL (ref 70–99)
Potassium: 4.3 mmol/L (ref 3.5–5.2)
Sodium: 141 mmol/L (ref 134–144)
Total Protein: 7 g/dL (ref 6.0–8.5)
eGFR: 74 mL/min/1.73 (ref >59)

## 2024-01-23 LAB — LIPID PANEL
Chol/HDL Ratio: 3.8 ratio (ref 0.0–4.4)
Cholesterol, Total: 188 mg/dL (ref 100–199)
HDL: 50 mg/dL (ref >39)
LDL Chol Calc (NIH): 118 mg/dL — ABNORMAL HIGH (ref 0–99)
Triglycerides: 109 mg/dL (ref 0–149)
VLDL Cholesterol Cal: 20 mg/dL (ref 5–40)

## 2024-01-25 ENCOUNTER — Ambulatory Visit (HOSPITAL_BASED_OUTPATIENT_CLINIC_OR_DEPARTMENT_OTHER): Payer: Self-pay | Admitting: Family

## 2024-01-25 ENCOUNTER — Encounter (INDEPENDENT_AMBULATORY_CARE_PROVIDER_SITE_OTHER): Payer: Self-pay

## 2024-01-25 DIAGNOSIS — Z5181 Encounter for therapeutic drug level monitoring: Secondary | ICD-10-CM

## 2024-01-25 DIAGNOSIS — E7800 Pure hypercholesterolemia, unspecified: Secondary | ICD-10-CM

## 2024-01-25 NOTE — Telephone Encounter (Signed)
 FLP/LFTs ordered and released to [facility].  Patient informed of recommendation and need for labs in 3 months via result note.

## 2024-01-28 ENCOUNTER — Encounter (INDEPENDENT_AMBULATORY_CARE_PROVIDER_SITE_OTHER): Payer: Self-pay

## 2024-02-08 ENCOUNTER — Encounter (INDEPENDENT_AMBULATORY_CARE_PROVIDER_SITE_OTHER): Payer: Self-pay

## 2024-02-18 ENCOUNTER — Encounter (INDEPENDENT_AMBULATORY_CARE_PROVIDER_SITE_OTHER): Payer: Self-pay | Admitting: Nurse Practitioner

## 2024-02-18 ENCOUNTER — Ambulatory Visit (INDEPENDENT_AMBULATORY_CARE_PROVIDER_SITE_OTHER): Admitting: Nurse Practitioner

## 2024-02-18 VITALS — BP 123/78 | HR 74 | Temp 98.4°F | Ht 62.5 in | Wt 173.0 lb

## 2024-02-18 DIAGNOSIS — Z96653 Presence of artificial knee joint, bilateral: Secondary | ICD-10-CM

## 2024-02-18 DIAGNOSIS — I2510 Atherosclerotic heart disease of native coronary artery without angina pectoris: Secondary | ICD-10-CM | POA: Diagnosis not present

## 2024-02-18 DIAGNOSIS — M170 Bilateral primary osteoarthritis of knee: Secondary | ICD-10-CM

## 2024-02-18 DIAGNOSIS — E7800 Pure hypercholesterolemia, unspecified: Secondary | ICD-10-CM | POA: Diagnosis not present

## 2024-02-18 DIAGNOSIS — Z6831 Body mass index (BMI) 31.0-31.9, adult: Secondary | ICD-10-CM

## 2024-02-18 DIAGNOSIS — E66811 Obesity, class 1: Secondary | ICD-10-CM | POA: Diagnosis not present

## 2024-02-18 DIAGNOSIS — Z0289 Encounter for other administrative examinations: Secondary | ICD-10-CM

## 2024-02-18 NOTE — Progress Notes (Signed)
 [address] [place], [place], [place] [address] Office: [phone]  /  Fax: [phone]   Initial Consultation    Rachel Velez was seen in clinic today to evaluate for obesity. She is interested in losing weight to improve overall health and reduce the risk of weight related complications. She presents today to review program treatment options, initial physical assessment, and evaluation.    She does have pure hypercholesterolemia and is followed at cardiology. She did not tolerate rosuvastatin  or pravastatin . She was switched to Zetia , which she is taking every day. She did have coronary artery calcium  CT scoring performed 05/14/21 and had a coronary artery calcium  score of 1.1 which was 57th percentile.   She does have osteoarthritis of knees bilaterally and had bilateral knee replacements in 2016.  She does also notice arthritis pain in hands bilaterally and follows with rheumatology.   She does have a weight goal of 150 pounds. She has tried Weight Watchers after the birth of each child. She would lose the weight but eventually would regain. She wants to lose weight for her health. Her father died of MI at age 22.   Anthropometrics and Bioimpedance Analysis   Body mass index is 31.14 kg/m. Body Fat Mass : 41.9 % Visceral Fat Mass Rating : 12   Obesity Related Diseases and Complications  Obesity Quality of Life and Psychosocial Complications: Body image dissatisfaction, Reduced health-related quality of life, and Decrease physical activity and social participation  Cardiometabolic: Dyslipidemia or hypercholesterolemia, Coronary artery disease or calcifications on vascular imaging, DOE, and Fatigue  Biomechanical: Osteoarthritis of the knee or hip   Weight Related History  She was referred by: Specialist- cardiologist  When asked what they would like to accomplish? She states: Adopt a healthier eating pattern and lifestyle, Improve energy levels and physical activity, Improve  existing medical conditions, and Improve quality of life  Weight history: Weight gain first noted after birth of children 32 and 38  Highest weight: 178  Contributing factors: family history of obesity, disruption of circadian rhythm / sleep disordered breathing, consumption of processed foods, moderate to high levels of stress, strong orexigenic signaling and/or inadequate inhibitory control , multiple weight loss attempts in the past, and need for convenient foods  Prior weight loss attempts: Weight Watchers  Current or previous pharmacotherapy: Phen Fen  Response to medication: Lost weight initially but was unable to sustain weight loss  Current nutrition plan: None  Greatest challenge with dieting: felt restrictive.  Current level of physical activity: Walking 40 minutes, three a week  Barriers to Exercise: None  Readiness and Motivation  On a scale from 0 to 10 How ready are you to make changes to your eating and physical activity to lose weight? 7 How important is it for you to lose weight right now ? 10 How confident are you that you can lose weight if you try? 10  Past Medical History   Past Medical History:  Diagnosis Date   Arthritis    Asthma    CAD in native artery 05/23/2021   Complication of anesthesia    Environmental allergies    PONV (postoperative nausea and vomiting)    Pure hypercholesterolemia 05/23/2021   Vertigo      Objective    BP 123/78   Pulse 74   Temp 98.4 F (36.9 C)   Ht 5' 2.5 (1.588 m)   Wt 173 lb (78.5 kg)   SpO2 100%   BMI 31.14 kg/m  She was weighed on  the bioimpedance scale: Body mass index is 31.14 kg/m.    General:  Alert, oriented and cooperative. Patient is in no acute distress.  Respiratory: Normal respiratory effort, no problems with respiration noted   Gait: able to ambulate independently  Mental Status: Normal mood and affect. Normal behavior. Normal judgment and thought content.   Diagnostic Data  Reviewed  BMET    Component Value Date/Time   NA 141 01/22/2024 0859   K 4.3 01/22/2024 0859   CL 103 01/22/2024 0859   CO2 21 01/22/2024 0859   GLUCOSE 84 01/22/2024 0859   GLUCOSE 101 (H) 06/13/2015 0532   BUN 12 01/22/2024 0859   CREATININE 0.86 01/22/2024 0859   CALCIUM  9.4 01/22/2024 0859   GFRNONAA >60 06/13/2015 0532   GFRAA >60 06/13/2015 0532   No results found for: HGBA1C No results found for: INSULIN CBC    Component Value Date/Time   WBC 9.3 06/13/2015 0532   RBC 3.43 (L) 06/13/2015 0532   HGB 10.1 (L) 06/13/2015 0532   HCT 31.5 (L) 06/13/2015 0532   PLT 190 06/13/2015 0532   MCV 91.8 06/13/2015 0532   MCH 29.4 06/13/2015 0532   MCHC 32.1 06/13/2015 0532   RDW 12.4 06/13/2015 0532   Iron/TIBC/Ferritin/ %Sat No results found for: IRON, TIBC, FERRITIN, IRONPCTSAT Lipid Panel     Component Value Date/Time   CHOL 188 01/22/2024 0859   TRIG 109 01/22/2024 0859   HDL 50 01/22/2024 0859   CHOLHDL 3.8 01/22/2024 0859   LDLCALC 118 (H) 01/22/2024 0859   Hepatic Function Panel     Component Value Date/Time   PROT 7.0 01/22/2024 0859   ALBUMIN 4.3 01/22/2024 0859   AST 24 01/22/2024 0859   ALT 22 01/22/2024 0859   ALKPHOS 74 01/22/2024 0859   BILITOT 0.4 01/22/2024 0859   No results found for: TSH  Medications  Outpatient Encounter Medications as of 02/18/2024  Medication Sig   Acetaminophen  (TYLENOL  ARTHRITIS PAIN PO) Take 1 tablet by mouth as needed.   diclofenac Sodium (VOLTAREN) 1 % GEL Apply topically 4 (four) times daily.   ezetimibe  (ZETIA ) 10 MG tablet Take 1 tablet (10 mg total) by mouth daily.   Levocetirizine Dihydrochloride (XYZAL ALLERGY 24HR PO) Take by mouth.   Omega-3 Fatty Acids (FISH OIL PO) Take 1 capsule by mouth daily.   triamcinolone cream (KENALOG) 0.1 % Apply topically 2 (two) times daily as needed.   CALCIUM -MAGNESIUM-ZINC PO Take 1 tablet by mouth daily. (Patient not taking: Reported on 02/18/2024)   Multiple  Vitamins-Minerals (WOMENS 50+ MULTI VITAMIN PO) Take by mouth. (Patient not taking: Reported on 02/18/2024)   Turmeric (QC TUMERIC COMPLEX PO) Take 1 tablet by mouth daily. (Patient not taking: Reported on 02/18/2024)   No facility-administered encounter medications on file as of 02/18/2024.     Assessment and Plan   CAD in native artery  Pure hypercholesterolemia  Status post total bilateral knee replacement  Osteoarthritis of both knees, unspecified osteoarthritis type  Class 1 obesity with serious comorbidity and body mass index (BMI) of 31.0 to 31.9 in adult, unspecified obesity type       Obesity Treatment and Action Plan:  Patient will work on garnering support from family and friends to begin weight loss journey. Will work on eliminating or reducing the presence of highly palatable, calorie dense foods in the home. Will complete provided nutritional and psychosocial assessment questionnaire before the next appointment. Will be scheduled for indirect calorimetry to determine resting energy expenditure in  a fasting state.  This will allow us  to create a reduced calorie, high-protein meal plan to promote loss of fat mass while preserving muscle mass. Counseled on the health benefits of losing 5%-15% of total body weight. Was counseled on nutritional approaches to weight loss and benefits of reducing processed foods and consuming plant-based foods and high quality protein as part of nutritional weight management. Was counseled on pharmacotherapy and role as an adjunct in weight management.   Education and Additional resources  She was weighed on the bioimpedance scale and results were discussed and documented in the synopsis.  We discussed obesity as a progressive, chronic disease and the importance of a more detailed evaluation of all the factors contributing to the disease.  We reviewed the basic principles in obesity management.   We discussed the importance of long term  lifestyle changes which include nutrition, exercise and behavioral modification as well as the importance of customizing this to her specific health and social needs.  We reviewed the role of medical interventions including pharmacotherapy and surgical interventions.   We discussed the benefits of reaching a healthier weight to alleviate the symptoms of existing conditions and reduce the risks of the biomechanical, cardiometabolic and psychological effects of obesity.  We reviewed our program approach and philosophy, which are guided by the four pillars of obesity medicine.  We discussed how to prepare for intake appointment and the importance of fasting and avoidance of stimulants for at least 8 hours prior to indirect calorimetry.  Dellanira W Suppes appears to be in the action stage of change and reports being ready to initiate intensive lifestyle and behavioral modifications as part of their weight loss journey.  Attestation  Reviewed by clinician on day of visit: allergies, medications, problem list, medical history, surgical history, family history, social history, and previous encounter notes pertinent to obesity diagnosis.  I have spent 23 minutes in the care of the patient today including: 11 minutes before the visit reviewing and preparing the chart. 17 minutes face-to-face assessing and reviewing listed medical problems as outlined in obesity care plan, providing nutritional and behavioral counseling on topics outlined in the obesity care plan, independently interpreting test results and goals of care, as described in assessment and plan, reviewing and discussing biometric information and progress, and reviewing latest PCP notes and specialist consultations 3 minutes after the visit updating chart and documentation of encounter.  Mirra Basilio ANP-C

## 2024-02-23 NOTE — Progress Notes (Signed)
 [address] [place],  [place], [place] [address]  Office: [phone]  /  Fax: [phone]   Subjective   Initial Visit  Rachel Velez (MR# [id number]) is a 68 y.o. female who presents for evaluation and treatment of obesity and related comorbidities. Current BMI is Body mass index is 31.32 kg/m. Rachel Velez has been struggling with her weight for many years and has been unsuccessful in either losing weight, maintaining weight loss, or reaching her healthy weight goal.  Rachel Velez is currently in the action stage of change and ready to dedicate time achieving and maintaining a healthier weight. Rachel Velez is interested in becoming our patient and working on intensive lifestyle modifications including (but not limited to) diet and exercise for weight loss.  Weight history:  Rachel Velez does have pure hypercholesterolemia and is followed at cardiology. Rachel Velez did not tolerate rosuvastatin  or pravastatin . Rachel Velez was switched to Zetia , which Rachel Velez is taking every day. Rachel Velez did have coronary artery calcium  CT scoring performed 05/14/21 and had a coronary artery calcium  score of 1.1 which was 57th percentile, placed on Zetia  at that time after statin intolerance.    Rachel Velez does have osteoarthritis of knees bilaterally and had bilateral knee replacements in 2016.  Rachel Velez does also notice arthritis pain in hands bilaterally and follows with rheumatology.     Rachel Velez does have a weight goal of 150 pounds. Rachel Velez has tried Weight Watchers after the birth of each child. Rachel Velez would lose the weight but eventually would regain. Rachel Velez wants to lose weight for her health. Her father died of MI at age 107.   When asked how their weight has affected their life and health, Rachel Velez states: Other: Doesn't like how Rachel Velez looks in her clothes, arthritis worse with weight gain  When asked what else they would like to accomplish? Rachel Velez states: Adopt a healthier eating pattern and lifestyle, Improve energy levels and physical activity, Improve existing medical conditions,  Reduce number of medications, Improve quality of life, and Lose 25 lbs  Rachel Velez starting to note weight gain during : Weight gain first noted after birth of children  age 38 and 50   Life events associated with weight gain include : pregnancy.   Other contributing factors: family history of obesity, disruption of circadian rhythm / sleep disordered breathing, consumption of processed foods, use of obesogenic medications: Anticholinergics, moderate to high levels of stress, strong orexigenic signaling and/or inadequate inhibitory control , multiple weight loss attempts in the past, and need for convenient foods.  Their highest weight has been:  178 lbs.  Desired weight: 155-160  Previous weight-loss programs : Weight Watchers.  Their maximum weight loss was:  30 lbs.  Their greatest challenge with dieting: felt restrictive.  Current or previous pharmacotherapy: Other: PhenFen.  Response to medication: Lost weight initially but was unable to sustain weight loss   Nutritional History:  Current nutrition plan: None.  How many times do you eat outside the home: > 7 per week  How often do they skip meals: occasionally but infrequent  What beverages do they drink: water, caffeinated beverages-coffee with milk, honey or splenda , diet soda , and alcohol, type: wine, 4 drinks per week.   Use of artificial sweetners : Yes  Food intolerances or dislikes: dairy: cheese, milk, yogurt. Mayonnaise  Food triggers: Eating out and To help comfort self.  Food cravings: Sugary and Chocolate  Do they struggle with excessive hunger or portion control : Yes    Physical Activity:  Current level of physical activity:  Walking 40 minutes, three a week. Gets approximately 5000 steps daily  Barriers to Exercise: None   Past medical history includes:   Past Medical History:  Diagnosis Date   Arthritis    Asthma    Back pain    CAD in native artery 05/23/2021   Complication of anesthesia     Coronary artery disease    Environmental allergies    Joint pain    Lactose intolerance    Osteoarthritis    PONV (postoperative nausea and vomiting)    Pure hypercholesterolemia 05/23/2021   Seasonal allergies    Vertigo    Vitamin D  deficiency      Objective   BP 131/84   Pulse 78   Temp 98.3 F (36.8 C)   Ht 5' 2.5 (1.588 m)   Wt 174 lb (78.9 kg)   SpO2 94%   BMI 31.32 kg/m  Rachel Velez was weighed on the bioimpedance scale: Body mass index is 31.32 kg/m.    Anthropometrics:  Vitals Temp: 98.3 F (36.8 C) BP: 131/84 Pulse Rate: 78 SpO2: 94 %   Anthropometric Measurements Height: 5' 2.5 (1.588 m) Weight: 174 lb (78.9 kg) BMI (Calculated): 31.3 Weight at Last Visit: N/A Weight Lost Since Last Visit: N/A Weight Gained Since Last Visit: N/A Starting Weight: N/A Peak Weight: 174 lb Waist Measurement : 38.5 inches   Body Composition  Body Fat %: 40 % Fat Mass (lbs): 69.6 lbs Muscle Mass (lbs): 99.2 lbs Total Body Water (lbs): 67.4 lbs Visceral Fat Rating : 11   Other Clinical Data Fasting: Yes Labs: Yes Today's Visit #: #1 Starting Date: 02/24/24 Comments: First Visit    Physical Exam:  General: Rachel Velez is overweight, cooperative, alert, well developed, and in no acute distress. PSYCH: Has normal mood, affect and thought process.   HEENT: EOMI, sclerae are anicteric. Lungs: Normal breathing effort, no conversational dyspnea. Extremities: No edema.  Neurologic: No gross sensory or motor deficits. No tremors or fasciculations noted.    Diagnostic Data Reviewed  EKG: Done 01/22/24- I personally reviewed Sinus rhythm with occasional Premature ventricular complexes When compared with ECG of 20-Jan-2023 08:24, Nonspecific T wave abnormality, improved in Lateral leads Confirmed by Raford Riggs ([id number]) on 01/22/2024 8:09:00 AM   Indirect Calorimeter completed today shows a VO2 of 214 and a REE of 1469.  Her calculated basal metabolic rate is 8568 thus her  resting energy expenditure same as calculated.  Depression Screen  Rachel Velez's PHQ-9 score was: 5.      No data to display          Screening for Sleep Related Breathing Disorders  Rachel Velez admits to daytime somnolence and denies waking up still tired. Patient has a history of symptoms of daytime fatigue. Rachel Velez generally gets 7 or 8 hours of sleep per night, and states that Rachel Velez has generally restful sleep. Snoring is present. Apneic episodes are not present. Epworth Sleepiness Score is 5.   BMET    Component Value Date/Time   NA 141 01/22/2024 0859   K 4.3 01/22/2024 0859   CL 103 01/22/2024 0859   CO2 21 01/22/2024 0859   GLUCOSE 84 01/22/2024 0859   GLUCOSE 101 (H) 06/13/2015 0532   BUN 12 01/22/2024 0859   CREATININE 0.86 01/22/2024 0859   CALCIUM  9.4 01/22/2024 0859   GFRNONAA >60 06/13/2015 0532   GFRAA >60 06/13/2015 0532   No results found for: HGBA1C No results found for: INSULIN  CBC    Component Value Date/Time   WBC  9.3 06/13/2015 0532   RBC 3.43 (L) 06/13/2015 0532   HGB 10.1 (L) 06/13/2015 0532   HCT 31.5 (L) 06/13/2015 0532   PLT 190 06/13/2015 0532   MCV 91.8 06/13/2015 0532   MCH 29.4 06/13/2015 0532   MCHC 32.1 06/13/2015 0532   RDW 12.4 06/13/2015 0532   Iron/TIBC/Ferritin/ %Sat No results found for: IRON, TIBC, FERRITIN, IRONPCTSAT Lipid Panel     Component Value Date/Time   CHOL 188 01/22/2024 0859   TRIG 109 01/22/2024 0859   HDL 50 01/22/2024 0859   CHOLHDL 3.8 01/22/2024 0859   LDLCALC 118 (H) 01/22/2024 0859   Hepatic Function Panel     Component Value Date/Time   PROT 7.0 01/22/2024 0859   ALBUMIN 4.3 01/22/2024 0859   AST 24 01/22/2024 0859   ALT 22 01/22/2024 0859   ALKPHOS 74 01/22/2024 0859   BILITOT 0.4 01/22/2024 0859   No results found for: TSH   Assessment and Plan   TREATMENT PLAN FOR OBESITY:  Recommended Dietary Goals  Lorrine is currently in the action stage of change. As such, her goal is to  implement medically supervised obesity management plan.  Rachel Velez has agreed to implement: the Category 1 plan - 1000 kcal per day  Behavioral Intervention  We discussed the following Behavioral Modification Strategies today: increasing lean protein intake to established goals, decreasing simple carbohydrates , increasing vegetables, increasing lower glycemic fruits, increasing fiber rich foods, avoiding skipping meals, increasing water intake, work on meal planning and preparation, work on tracking and journaling calories using tracking application, reading food labels , keeping healthy foods at home, identifying sources and decreasing liquid calories, decreasing eating out or consumption of processed foods, and making healthy choices when eating convenient foods, planning for success, and better snacking choices  Additional resources provided today: Handout and personalized instruction on tracking and journaling using Apps, Handout on healthy eating and balanced plate, Handout on complex carbohydrates and lean sources of protein, Personalized instruction on the use of artificial intelligence for recipes, tailored meal plans, calorie tracking, and finding healthier options when eating out. , and Category 1 packet  Recommended Physical Activity Goals  Sherronda has been advised to work up to 150 minutes of moderate intensity aerobic activity a week and strengthening exercises 2-3 times per week for cardiovascular health, weight loss maintenance and preservation of muscle mass.   Rachel Velez has agreed to :  Think about enjoyable ways to increase daily physical activity and overcoming barriers to exercise and Increase physical activity in their day and reduce sedentary time (increase NEAT).  Medical Interventions and Pharmacotherapy We will work on building a Therapist, art and behavioral strategies. We will discuss the role of pharmacotherapy as an adjunct at subsequent visits.   ASSOCIATED  CONDITIONS ADDRESSED TODAY  Other Fatigue  Rachel Velez does feel that her weight is causing her energy to be lower than it should be. Fatigue may be related to obesity, depression or many other causes. Labs will be ordered, and in the meanwhile, Rachel Velez will focus on self care including making healthy food choices, increasing physical activity and focusing on stress reduction.  Shortness of Breath Rachel Velez notes increasing shortness of breath with physical activity and seems to be worsening over time with weight gain. Rachel Velez notes getting out of breath sooner with activity than Rachel Velez used to. This has not gotten worse recently. Rachel Velez denies shortness of breath at rest or orthopnea.  Other fatigue  Rachel Velez (shortness of breath on exertion)  CAD in  native artery  Pure hypercholesterolemia  Status post total bilateral knee replacement  Osteoarthritis of both knees, unspecified osteoarthritis type  Vitamin D  deficiency  Depression screening  Statin intolerance  Class 1 obesity with serious comorbidity and body mass index (BMI) of 31.0 to 31.9 in adult, unspecified obesity type    Follow-up  Rachel Velez was informed of the importance of frequent follow-up visits to maximize her success with intensive lifestyle modifications for her multiple health conditions. Rachel Velez was informed we would discuss her lab results at her next visit unless there is a critical issue that needs to be addressed sooner. Rachel Velez agreed to keep her next visit at the agreed upon time to discuss these results.  Attestation Statement  This is the patient's intake visit at [facility]. The patient's Health Questionnaire was reviewed at length. Included in the packet: current and past health history, medications, allergies, ROS, gynecologic history (women only), surgical history, family history, social history, weight history, weight loss surgery history (for those that have had weight loss surgery), nutritional evaluation, mood  and food questionnaire, PHQ9, Epworth questionnaire, sleep habits questionnaire, patient life and health improvement goals questionnaire. These will all be scanned into the patient's chart under media.   During the visit, I independently reviewed the patient's EKG, previous labs, bioimpedance scale results, and indirect calorimetry results. I used this information to medically tailor a meal plan for the patient that will help her to lose weight and will improve her obesity-related conditions. I performed a medically necessary appropriate examination and/or evaluation. I discussed the assessment and treatment plan with the patient. The patient was provided an opportunity to ask questions and all were answered. The patient agreed with the plan and demonstrated an understanding of the instructions. Labs were ordered at this visit and will be reviewed at the next visit unless critical results need to be addressed immediately. Clinical information was updated and documented in the EMR.   In addition, they received basic education on identification of processed foods and reduction of these, different sources of lean proteins and complex carbohydrates and how to eat balanced by incorporation of whole foods.  Reviewed by clinician on day of visit: allergies, medications, problem list, medical history, surgical history, family history, social history, and previous encounter notes.  I have spent 53 minutes in the care of the patient today including: 17 minutes before the visit reviewing and preparing the chart. 33 minutes face-to-face assessing and reviewing listed medical problems as outlined in obesity care plan, providing nutritional and behavioral counseling on topics outlined in the obesity care plan, independently interpreting test results and goals of care, as described in assessment and plan, reviewing and discussing biometric information and progress, reviewing latest PCP notes and specialist consultations,  managing referral , and ordering diagnostics - see orders 3 minutes after the visit updating chart and documentation of encounter.       Ankur Snowdon ANP-C

## 2024-02-24 ENCOUNTER — Ambulatory Visit (INDEPENDENT_AMBULATORY_CARE_PROVIDER_SITE_OTHER): Admitting: Nurse Practitioner

## 2024-02-24 ENCOUNTER — Encounter (INDEPENDENT_AMBULATORY_CARE_PROVIDER_SITE_OTHER): Payer: Self-pay | Admitting: Nurse Practitioner

## 2024-02-24 VITALS — BP 131/84 | HR 78 | Temp 98.3°F | Ht 62.5 in | Wt 174.0 lb

## 2024-02-24 DIAGNOSIS — E7800 Pure hypercholesterolemia, unspecified: Secondary | ICD-10-CM

## 2024-02-24 DIAGNOSIS — R0602 Shortness of breath: Secondary | ICD-10-CM | POA: Diagnosis not present

## 2024-02-24 DIAGNOSIS — Z1331 Encounter for screening for depression: Secondary | ICD-10-CM

## 2024-02-24 DIAGNOSIS — E559 Vitamin D deficiency, unspecified: Secondary | ICD-10-CM

## 2024-02-24 DIAGNOSIS — E66811 Obesity, class 1: Secondary | ICD-10-CM

## 2024-02-24 DIAGNOSIS — M170 Bilateral primary osteoarthritis of knee: Secondary | ICD-10-CM | POA: Diagnosis not present

## 2024-02-24 DIAGNOSIS — Z6831 Body mass index (BMI) 31.0-31.9, adult: Secondary | ICD-10-CM

## 2024-02-24 DIAGNOSIS — I2510 Atherosclerotic heart disease of native coronary artery without angina pectoris: Secondary | ICD-10-CM | POA: Diagnosis not present

## 2024-02-24 DIAGNOSIS — R5383 Other fatigue: Secondary | ICD-10-CM | POA: Diagnosis not present

## 2024-02-24 DIAGNOSIS — Z96653 Presence of artificial knee joint, bilateral: Secondary | ICD-10-CM

## 2024-02-24 DIAGNOSIS — Z789 Other specified health status: Secondary | ICD-10-CM

## 2024-02-25 ENCOUNTER — Ambulatory Visit (INDEPENDENT_AMBULATORY_CARE_PROVIDER_SITE_OTHER): Payer: Self-pay | Admitting: Nurse Practitioner

## 2024-02-25 LAB — CBC WITH DIFFERENTIAL/PLATELET
Basophils Absolute: 0.1 x10E3/uL (ref 0.0–0.2)
Basos: 1 %
EOS (ABSOLUTE): 0.2 x10E3/uL (ref 0.0–0.4)
Eos: 2 %
Hematocrit: 43.8 % (ref 34.0–46.6)
Hemoglobin: 13.9 g/dL (ref 11.1–15.9)
Immature Grans (Abs): 0.1 x10E3/uL (ref 0.0–0.1)
Immature Granulocytes: 1 %
Lymphocytes Absolute: 2.4 x10E3/uL (ref 0.7–3.1)
Lymphs: 27 %
MCH: 29.7 pg (ref 26.6–33.0)
MCHC: 31.7 g/dL (ref 31.5–35.7)
MCV: 94 fL (ref 79–97)
Monocytes Absolute: 0.7 x10E3/uL (ref 0.1–0.9)
Monocytes: 7 %
Neutrophils Absolute: 5.4 x10E3/uL (ref 1.4–7.0)
Neutrophils: 62 %
Platelets: 209 x10E3/uL (ref 150–450)
RBC: 4.68 x10E6/uL (ref 3.77–5.28)
RDW: 12.5 % (ref 11.7–15.4)
WBC: 8.9 x10E3/uL (ref 3.4–10.8)

## 2024-02-25 LAB — BASIC METABOLIC PANEL WITH GFR
BUN/Creatinine Ratio: 14 (ref 12–28)
BUN: 12 mg/dL (ref 8–27)
CO2: 22 mmol/L (ref 20–29)
Calcium: 9.8 mg/dL (ref 8.7–10.3)
Chloride: 100 mmol/L (ref 96–106)
Creatinine, Ser: 0.87 mg/dL (ref 0.57–1.00)
Glucose: 77 mg/dL (ref 70–99)
Potassium: 4.1 mmol/L (ref 3.5–5.2)
Sodium: 140 mmol/L (ref 134–144)
eGFR: 73 mL/min/1.73 (ref >59)

## 2024-02-25 LAB — HEMOGLOBIN A1C
Est. average glucose Bld gHb Est-mCnc: 111 mg/dL
Hgb A1c MFr Bld: 5.5 % (ref 4.8–5.6)

## 2024-02-25 LAB — TSH: TSH: 1.19 u[IU]/mL (ref 0.450–4.500)

## 2024-02-25 LAB — INSULIN, RANDOM: INSULIN: 12.8 u[IU]/mL (ref 2.6–24.9)

## 2024-02-25 LAB — VITAMIN B12: Vitamin B-12: 668 pg/mL (ref 232–1245)

## 2024-02-25 LAB — VITAMIN D 25 HYDROXY (VIT D DEFICIENCY, FRACTURES): Vit D, 25-Hydroxy: 28.7 ng/mL — ABNORMAL LOW (ref 30.0–100.0)

## 2024-03-07 ENCOUNTER — Encounter (INDEPENDENT_AMBULATORY_CARE_PROVIDER_SITE_OTHER): Payer: Self-pay | Admitting: Nurse Practitioner

## 2024-03-07 ENCOUNTER — Ambulatory Visit (INDEPENDENT_AMBULATORY_CARE_PROVIDER_SITE_OTHER): Admitting: Nurse Practitioner

## 2024-03-07 VITALS — BP 119/72 | HR 85 | Temp 98.3°F | Ht 62.5 in | Wt 168.0 lb

## 2024-03-07 DIAGNOSIS — Z789 Other specified health status: Secondary | ICD-10-CM | POA: Diagnosis not present

## 2024-03-07 DIAGNOSIS — E88819 Insulin resistance, unspecified: Secondary | ICD-10-CM | POA: Diagnosis not present

## 2024-03-07 DIAGNOSIS — M170 Bilateral primary osteoarthritis of knee: Secondary | ICD-10-CM

## 2024-03-07 DIAGNOSIS — Z6831 Body mass index (BMI) 31.0-31.9, adult: Secondary | ICD-10-CM

## 2024-03-07 DIAGNOSIS — I2510 Atherosclerotic heart disease of native coronary artery without angina pectoris: Secondary | ICD-10-CM

## 2024-03-07 DIAGNOSIS — E66811 Obesity, class 1: Secondary | ICD-10-CM

## 2024-03-07 DIAGNOSIS — E7800 Pure hypercholesterolemia, unspecified: Secondary | ICD-10-CM

## 2024-03-07 DIAGNOSIS — Z96653 Presence of artificial knee joint, bilateral: Secondary | ICD-10-CM

## 2024-03-07 DIAGNOSIS — Z6830 Body mass index (BMI) 30.0-30.9, adult: Secondary | ICD-10-CM

## 2024-03-07 DIAGNOSIS — E559 Vitamin D deficiency, unspecified: Secondary | ICD-10-CM

## 2024-03-07 MED ORDER — VITAMIN D (ERGOCALCIFEROL) 1.25 MG (50000 UNIT) PO CAPS: 50000.0000 [IU] | ORAL_CAPSULE | ORAL | 1 refills | Status: DC

## 2024-03-07 NOTE — Progress Notes (Addendum)
 Office: [phone]  /  Fax: [phone]  WEIGHT SUMMARY AND BIOMETRICS  Weight Lost Since Last Visit: 6 lb  Weight Gained Since Last Visit: 0   Vitals Temp: 98.3 F (36.8 C) BP: 119/72 Pulse Rate: 85 SpO2: 96 %   Anthropometric Measurements Height: 5' 2.5 (1.588 m) Weight: 168 lb (76.2 kg) BMI (Calculated): 30.22 Weight at Last Visit: 174 lb Weight Lost Since Last Visit: 6 lb Weight Gained Since Last Visit: 0 Starting Weight: 174 lb Total Weight Loss (lbs): 6 lb (2.722 kg) Peak Weight: 188 lb Waist Measurement : 38.5 inches   Body Composition  Body Fat %: 40.1 % Fat Mass (lbs): 67.4 lbs Muscle Mass (lbs): 95.4 lbs Total Body Water (lbs): 69 lbs Visceral Fat Rating : 11   Other Clinical Data RMR: 1469 Fasting: no Labs: no Today's Visit #: 2 Starting Date: 02/24/24   Total Weight Loss: 6 pounds PBW: 3.4%  HPI  Chief Complaint: OBESITY  Rachel Velez is here to discuss her progress with her obesity treatment plan. She is on the the Category 1 Plan and states she is following her eating plan approximately 100 % of the time. She states she is exercising 50-60 minutes 4-5 days per week.  Reviewed labwork which did reveal insulin  resistance with HOMA-IR score of 2.43. Vitamin D  level is also low at 28.7, currently on no supplementation.  She is currently on Zetia  10 mg every day, she does have statin intolerance- LDL remains elevated at 118.  Interval History:  Since last office visit she has been doing well.  She does not like dairy but has been replacing cheese with another protein source. 2 or less diet cokes a day , water and coffee with 1/3 packet splenda and 1/2 tsp honey and 1% milk. She has gone out to eat multiple times and made good choices. She had wine once a week, normally had 2 days a week.  She does believe she is getting 1000-1100 calories a day with 80 grams of protein.  Walking 50-60 minutes 4-5 times a week.  Reviewed Bioimpedence data: advised  will wait until we have 3 readings- the 2 readings show loss of muscle but uncertain if this is true difference She does have an appointment to see a new PCP Mliss Chill    She had a coronary calcium  score of 1.1, which was 57th percentile (05/2021) so she was referred to cardiology. She has a family history of CAD. Her father had a heart attack at age 69.  She was started on rosuvastatin  and encouraged exercise. She did not tolerate rosuvastatin  or pravastatin . She was switched to Zetia .  Currently taking Zetia  10 mg every day.   The 10-year ASCVD risk score (Arnett DK, et al., 2019) is: 6.5%- Borderline   Values used to calculate the score:     Age: 68 years     Clincally relevant sex: Female     Is Non-Hispanic African American: No     Diabetic: No     Tobacco smoker: No     Systolic Blood Pressure: 131 mmHg     Is BP treated: No     HDL Cholesterol: 50 mg/dL     Total Cholesterol: 188 mg/dL   Mammogram: 89/7975 Colonoscopy: 2019 due 10 years. Pap: Pt had abdominal hysterectomy, 05/07/23  PHYSICAL EXAM:  Blood pressure 119/72, pulse 85, temperature 98.3 F (36.8 C), height 5' 2.5 (1.588 m), weight 168 lb (76.2 kg), SpO2 96%. Body mass index is 30.24 kg/m.  General: She is overweight, cooperative, alert, well developed, and in no acute distress. PSYCH: Has normal mood, affect and thought process.   Extremities: No edema.  Neurologic: No gross sensory or motor deficits. No tremors or fasciculations noted.    DIAGNOSTIC DATA REVIEWED:  BMET    Component Value Date/Time   NA 140 02/24/2024 0930   K 4.1 02/24/2024 0930   CL 100 02/24/2024 0930   CO2 22 02/24/2024 0930   GLUCOSE 77 02/24/2024 0930   GLUCOSE 101 (H) 06/13/2015 0532   BUN 12 02/24/2024 0930   CREATININE 0.87 02/24/2024 0930   CALCIUM  9.8 02/24/2024 0930   GFRNONAA >60 06/13/2015 0532   GFRAA >60 06/13/2015 0532   Lab Results  Component Value Date   HGBA1C 5.5 02/24/2024   Lab Results  Component  Value Date   INSULIN  12.8 02/24/2024   Lab Results  Component Value Date   TSH 1.190 02/24/2024   CBC    Component Value Date/Time   WBC 8.9 02/24/2024 0930   WBC 9.3 06/13/2015 0532   RBC 4.68 02/24/2024 0930   RBC 3.43 (L) 06/13/2015 0532   HGB 13.9 02/24/2024 0930   HCT 43.8 02/24/2024 0930   PLT 209 02/24/2024 0930   MCV 94 02/24/2024 0930   MCH 29.7 02/24/2024 0930   MCH 29.4 06/13/2015 0532   MCHC 31.7 02/24/2024 0930   MCHC 32.1 06/13/2015 0532   RDW 12.5 02/24/2024 0930   Iron Studies No results found for: IRON, TIBC, FERRITIN, IRONPCTSAT Lipid Panel     Component Value Date/Time   CHOL 188 01/22/2024 0859   TRIG 109 01/22/2024 0859   HDL 50 01/22/2024 0859   CHOLHDL 3.8 01/22/2024 0859   LDLCALC 118 (H) 01/22/2024 0859   Hepatic Function Panel     Component Value Date/Time   PROT 7.0 01/22/2024 0859   ALBUMIN 4.3 01/22/2024 0859   AST 24 01/22/2024 0859   ALT 22 01/22/2024 0859   ALKPHOS 74 01/22/2024 0859   BILITOT 0.4 01/22/2024 0859      Component Value Date/Time   TSH 1.190 02/24/2024 0930   Nutritional Lab Results  Component Value Date   VD25OH 28.7 (L) 02/24/2024     ASSESSMENT AND PLAN  TREATMENT PLAN FOR OBESITY:  Recommended Dietary Goals  Tramya is currently in the action stage of change. As such, her goal is to continue weight management plan. She has agreed to the Category 1 Plan.  Behavioral Intervention  We discussed the following Behavioral Modification Strategies today: increasing vegetables, increasing fiber rich foods, increasing water intake , continue to work on maintaining a reduced calorie state, getting the recommended amount of protein, incorporating whole foods, making healthy choices, staying well hydrated and practicing mindfulness when eating., and increase protein intake, fibrous foods (25 grams per day for women, 30 grams for men) and water to improve satiety and decrease hunger signals. .  Additional  resources provided today: NA  Recommended Physical Activity Goals  Ceylin has been advised to work up to 150 minutes of moderate intensity aerobic activity a week and strengthening exercises 2-3 times per week for cardiovascular health, weight loss maintenance and preservation of muscle mass.   She has agreed to Continue current level of physical activity  and Increase physical activity in their day and reduce sedentary time (increase NEAT).   Pharmacotherapy We discussed various medication options to help Kalis with her weight loss efforts and we both agreed to continue without medication.  ASSOCIATED CONDITIONS ADDRESSED  TODAY  Action/Plan  CAD in native artery Pure hypercholesterolemia Statin intolerance - Continue Zetia  10 mg every day - Continue to limit saturated fats in diet - Follow meal plan and continue exercise with goal of weight loss.  Insulin  resistance HOMA-IR score 2.43 - Discussed insulin  resistance in detail. - Will work on losing weight to control but discussed potential use of Metformin in the future.  Status post total bilateral knee replacement Osteoarthritis of both knees, unspecified osteoarthritis type Continue to focus on weight loss and exercise  Vitamin D  deficiency Will start ergocalciferol  50000 units once a week for 12 weeks and then will recheck.  -     Vitamin D  (Ergocalciferol ); Take 1 capsule (50,000 Units total) by mouth every 7 (seven) days.  Dispense: 5 capsule; Refill: 1  Class 1 obesity with serious comorbidity and body mass index (BMI) of 31.0 to 31.9 in adult, unspecified obesity type Discussed different seasoning options for vegetables Discussed fruit choices- encouraged higher fiber fruits Continue Category 1 plan Continue walking 50-60 minutes 4-5 days a week Continue to limit alcohol Doing very well, denies cravings Reviewed Bioimpedence data: advised will wait until we have 3 readings- the 2 readings show loss of muscle but  uncertain if this is true difference. Will reevaluate at next visit.    Return in about 2 weeks (around 03/21/2024).Rachel Velez She was informed of the importance of frequent follow up visits to maximize her success with intensive lifestyle modifications for her multiple health conditions.   ATTESTASTION STATEMENTS:  Reviewed by clinician on day of visit: allergies, medications, problem list, medical history, surgical history, family history, social history, and previous encounter notes.   Time spent on visit including pre-visit chart review and post-visit care and charting was 40 minutes.   Malie Kashani ANP-C

## 2024-03-22 ENCOUNTER — Ambulatory Visit (INDEPENDENT_AMBULATORY_CARE_PROVIDER_SITE_OTHER): Admitting: Nurse Practitioner

## 2024-03-22 ENCOUNTER — Encounter (INDEPENDENT_AMBULATORY_CARE_PROVIDER_SITE_OTHER): Payer: Self-pay | Admitting: Nurse Practitioner

## 2024-03-22 VITALS — BP 118/76 | HR 64 | Temp 98.2°F | Ht 62.5 in | Wt 165.0 lb

## 2024-03-22 DIAGNOSIS — E559 Vitamin D deficiency, unspecified: Secondary | ICD-10-CM

## 2024-03-22 DIAGNOSIS — E88819 Insulin resistance, unspecified: Secondary | ICD-10-CM | POA: Diagnosis not present

## 2024-03-22 DIAGNOSIS — Z6831 Body mass index (BMI) 31.0-31.9, adult: Secondary | ICD-10-CM

## 2024-03-22 DIAGNOSIS — M79672 Pain in left foot: Secondary | ICD-10-CM

## 2024-03-22 DIAGNOSIS — E7800 Pure hypercholesterolemia, unspecified: Secondary | ICD-10-CM | POA: Diagnosis not present

## 2024-03-22 DIAGNOSIS — Z789 Other specified health status: Secondary | ICD-10-CM | POA: Diagnosis not present

## 2024-03-22 DIAGNOSIS — Z96653 Presence of artificial knee joint, bilateral: Secondary | ICD-10-CM

## 2024-03-22 DIAGNOSIS — E66811 Obesity, class 1: Secondary | ICD-10-CM

## 2024-03-22 MED ORDER — METFORMIN HCL ER 500 MG PO TB24: 500.0000 mg | ORAL_TABLET | Freq: Every day | ORAL | 0 refills | Status: DC

## 2024-03-22 NOTE — Addendum Note (Signed)
 Addended by: JUDE BRUNET E on: 03/22/2024 12:25 PM   Modules accepted: Level of Service

## 2024-03-22 NOTE — Progress Notes (Signed)
 Office: [phone]  /  Fax: [phone]  WEIGHT SUMMARY AND BIOMETRICS  Weight Lost Since Last Visit: 3 lb  Weight Gained Since Last Visit: 0   Vitals Temp: 98.2 F (36.8 C) BP: 118/76 Pulse Rate: 64 SpO2: 98 %   Anthropometric Measurements Height: 5' 2.5 (1.588 m) Weight: 165 lb (74.8 kg) BMI (Calculated): 29.68 Weight at Last Visit: 168 lb Weight Lost Since Last Visit: 3 lb Weight Gained Since Last Visit: 0 Starting Weight: 174 lb Total Weight Loss (lbs): 9 lb (4.082 kg) Peak Weight: 188 lb   Body Composition  Body Fat %: 39.1 % Fat Mass (lbs): 64.6 lbs Muscle Mass (lbs): 95.6 lbs Total Body Water (lbs): 65.6 lbs Visceral Fat Rating : 11   Other Clinical Data Fasting: No Labs: No Today's Visit #: 3 Starting Date: 02/24/24    Total Weight Loss: 9 pounds Percent of body weight lost: 5%   HPI  Chief Complaint: OBESITY  Rachel Velez is here to discuss her progress with her obesity treatment plan. She is on the the Category 1 Plan and states she is following her eating plan approximately 80 % of the time. She states she is exercising 50 minutes 3-4 days per week. She has noted a vibration in her left heel which is occurring every couple seconds for a short period.   Interval History:  Since last office visit she did have a couple of cheats - ordered pizza but had thin crust with chicken and onion and peppers. She has been having some pumpkin spice treats but has eaten a bite or 2 a day, will also reward herself with 1 chocolate covered almond if she gets a craving for a sweet. She has been getting 1100 calories a day and hitting her protein goal of 80 -90 grams at least 80 % of the time. She has been walking 50 minutes(2 1/2 miles) 3-4 days a week. She has noticed that her left heel has begun to have a vibration/tingling sensation that occurs every couple of seconds. Has a history of bone spurs in that heel  She continues to take Zetia  daily for hyperlipidemia  without side effects.  Discussed use of Metformin  for insulin  resistance and explained the pathophysiology of insulin  resistance.  She is agreeable to start Metformin .     PHYSICAL EXAM:  Blood pressure 118/76, pulse 64, temperature 98.2 F (36.8 C), height 5' 2.5 (1.588 m), weight 165 lb (74.8 kg), SpO2 98%. Body mass index is 29.7 kg/m.  General: She is overweight, cooperative, alert, well developed, and in no acute distress. PSYCH: Has normal mood, affect and thought process.   Extremities: No edema.  Neurologic: No gross sensory or motor deficits. No tremors or fasciculations noted.    DIAGNOSTIC DATA REVIEWED:  BMET    Component Value Date/Time   NA 140 02/24/2024 0930   K 4.1 02/24/2024 0930   CL 100 02/24/2024 0930   CO2 22 02/24/2024 0930   GLUCOSE 77 02/24/2024 0930   GLUCOSE 101 (H) 06/13/2015 0532   BUN 12 02/24/2024 0930   CREATININE 0.87 02/24/2024 0930   CALCIUM  9.8 02/24/2024 0930   GFRNONAA >60 06/13/2015 0532   GFRAA >60 06/13/2015 0532   Lab Results  Component Value Date   HGBA1C 5.5 02/24/2024   Lab Results  Component Value Date   INSULIN  12.8 02/24/2024   Lab Results  Component Value Date   TSH 1.190 02/24/2024   CBC    Component Value Date/Time   WBC 8.9 02/24/2024  0930   WBC 9.3 06/13/2015 0532   RBC 4.68 02/24/2024 0930   RBC 3.43 (L) 06/13/2015 0532   HGB 13.9 02/24/2024 0930   HCT 43.8 02/24/2024 0930   PLT 209 02/24/2024 0930   MCV 94 02/24/2024 0930   MCH 29.7 02/24/2024 0930   MCH 29.4 06/13/2015 0532   MCHC 31.7 02/24/2024 0930   MCHC 32.1 06/13/2015 0532   RDW 12.5 02/24/2024 0930   Iron Studies No results found for: IRON, TIBC, FERRITIN, IRONPCTSAT Lipid Panel     Component Value Date/Time   CHOL 188 01/22/2024 0859   TRIG 109 01/22/2024 0859   HDL 50 01/22/2024 0859   CHOLHDL 3.8 01/22/2024 0859   LDLCALC 118 (H) 01/22/2024 0859   Hepatic Function Panel     Component Value Date/Time   PROT 7.0  01/22/2024 0859   ALBUMIN 4.3 01/22/2024 0859   AST 24 01/22/2024 0859   ALT 22 01/22/2024 0859   ALKPHOS 74 01/22/2024 0859   BILITOT 0.4 01/22/2024 0859      Component Value Date/Time   TSH 1.190 02/24/2024 0930   Nutritional Lab Results  Component Value Date   VD25OH 28.7 (L) 02/24/2024     ASSESSMENT AND PLAN  TREATMENT PLAN FOR OBESITY:  Recommended Dietary Goals  Rachel Velez is currently in the action stage of change. As such, her goal is to continue weight management plan. She has agreed to the Category 2 Plan.  Behavioral Intervention  We discussed the following Behavioral Modification Strategies today: continue to work on maintaining a reduced calorie state, getting the recommended amount of protein, incorporating whole foods, making healthy choices, staying well hydrated and practicing mindfulness when eating. and increase protein intake, fibrous foods (25 grams per day for women, 30 grams for men) and water to improve satiety and decrease hunger signals. .  Additional resources provided today: NA  Recommended Physical Activity Goals  Rachel Velez has been advised to work up to 150 minutes of moderate intensity aerobic activity a week and strengthening exercises 2-3 times per week for cardiovascular health, weight loss maintenance and preservation of muscle mass.   She has agreed to Think about enjoyable ways to increase daily physical activity and overcoming barriers to exercise, Increase physical activity in their day and reduce sedentary time (increase NEAT)., Increase volume of physical activity to a goal of 240 minutes a week, and Combine aerobic and strengthening exercises for efficiency and improved cardiometabolic health.   Pharmacotherapy We discussed various medication options to help Rachel Velez with her weight loss efforts and we both agreed to start Metformin  500 mg 1 tab daily with largest meal for insulin  resistance and off label for weight loss. .  ASSOCIATED  CONDITIONS ADDRESSED TODAY  Action/Plan  Pure hypercholesterolemia Statin intolerance Continue Category 1 meal plan Continue limiting saturated fats Continue Zetia  10 mg 1 tab daily Continue increasing activity and focusing on weight loss as loss of 10-15% body weight can improve lipid levels.   Insulin  resistance HOMA-IR score 2.43 Continue Category 1 meal, reduce simple carbohydrates Continue increasing activity and focusing on weight loss as loss of 10-15% body weight can improve blood sugars Initiate Metformin  ER 500 mg once a day with largest meal -     metFORMIN  HCl ER; Take 1 tablet (500 mg total) by mouth daily with breakfast.  Dispense: 30 tablet; Refill: 0  Status post total bilateral knee replacement       Continue activity       Focus on weight loss  as reduction of 10-15% of body weight can improve symptoms  Vitamin D  deficiency       Continue ergocalciferol  50000 units once a week, will recheck level in November  Class 1 obesity with serious comorbidity and body mass index (BMI) of 31.0 to 31.9 in adult, unspecified obesity type       Refer to plan above  Pain of left heel Will refer to podiatry for further evaluation , does have a history of bone spurs in left foot.  -     Ambulatory referral to Podiatry         Return in about 3 weeks (around 04/12/2024).SABRA She was informed of the importance of frequent follow up visits to maximize her success with intensive lifestyle modifications for her multiple health conditions.   ATTESTASTION STATEMENTS:  Reviewed by clinician on day of visit: allergies, medications, problem list, medical history, surgical history, family history, social history, and previous encounter notes.   I personally spent a total of 35 minutes in the care of the patient today including preparing to see the patient, getting/reviewing separately obtained history, performing a medically appropriate exam/evaluation, counseling and educating, placing  orders, documenting clinical information in the EHR, independently interpreting results, and communicating results.   Elfego Giammarino ANP-C

## 2024-04-04 ENCOUNTER — Encounter (INDEPENDENT_AMBULATORY_CARE_PROVIDER_SITE_OTHER): Payer: Self-pay | Admitting: Nurse Practitioner

## 2024-04-12 ENCOUNTER — Ambulatory Visit (INDEPENDENT_AMBULATORY_CARE_PROVIDER_SITE_OTHER): Admitting: Nurse Practitioner

## 2024-04-12 ENCOUNTER — Encounter (INDEPENDENT_AMBULATORY_CARE_PROVIDER_SITE_OTHER): Payer: Self-pay | Admitting: Nurse Practitioner

## 2024-04-12 VITALS — BP 124/85 | HR 65 | Temp 98.3°F | Ht 62.5 in | Wt 161.0 lb

## 2024-04-12 DIAGNOSIS — E66811 Obesity, class 1: Secondary | ICD-10-CM

## 2024-04-12 DIAGNOSIS — E88819 Insulin resistance, unspecified: Secondary | ICD-10-CM

## 2024-04-12 DIAGNOSIS — Z6831 Body mass index (BMI) 31.0-31.9, adult: Secondary | ICD-10-CM

## 2024-04-12 DIAGNOSIS — E7800 Pure hypercholesterolemia, unspecified: Secondary | ICD-10-CM

## 2024-04-12 DIAGNOSIS — E559 Vitamin D deficiency, unspecified: Secondary | ICD-10-CM

## 2024-04-12 DIAGNOSIS — M79672 Pain in left foot: Secondary | ICD-10-CM | POA: Diagnosis not present

## 2024-04-12 MED ORDER — METFORMIN HCL ER 500 MG PO TB24: 500.0000 mg | ORAL_TABLET | Freq: Every day | ORAL | 1 refills | Status: DC

## 2024-04-12 MED ORDER — VITAMIN D (ERGOCALCIFEROL) 1.25 MG (50000 UNIT) PO CAPS: 50000.0000 [IU] | ORAL_CAPSULE | ORAL | 1 refills | Status: DC

## 2024-04-12 NOTE — Progress Notes (Signed)
 Office: [phone]  /  Fax: [phone]  WEIGHT SUMMARY AND BIOMETRICS  Weight Lost Since Last Visit: 4lb  Weight Gained Since Last Visit: 0lb   Vitals Temp: 98.3 F (36.8 C) BP: 124/85 Pulse Rate: 65 SpO2: 97 %   Anthropometric Measurements Height: 5' 2.5 (1.588 m) Weight: 161 lb (73 kg) BMI (Calculated): 28.96 Weight at Last Visit: 165lb Weight Lost Since Last Visit: 4lb Weight Gained Since Last Visit: 0lb Starting Weight: 174lb Total Weight Loss (lbs): 13 lb (5.897 kg) Peak Weight: 188lb Waist Measurement : 38.5 inches   Body Composition  Body Fat %: 38.5 % Fat Mass (lbs): 62 lbs Muscle Mass (lbs): 94 lbs Total Body Water (lbs): 67 lbs Visceral Fat Rating : 10   Other Clinical Data RMR: 1469 Fasting: No Labs: No Today's Visit #: 4 Starting Date: 02/24/24    Total Weight Loss: 13 pounds Percent of body weight lost: 7.5% Bio Impedance Data reviewed with patient: Down 1.6 pounds of muscle mass and down 2.6 pounds of adipose  HPI  Chief Complaint: OBESITY  Rachel Velez is here to discuss her progress with her obesity treatment plan. She is on the the Category 1 Plan and states she is following her eating plan approximately 85 % of the time. She states she is exercising - walking 50 minutes 4 days per week.   Interval History:  Since last office visit she has started Metformin  500 mg 1 tab daily with largest meal. No side effects. She continues to  take Zetia  10 mg every day for hypercholesterolemia without side effects. A referral has been placed for nerve type sensation in left heel with podiatry, Dr. Gershon- appointment is scheduled for Monday. She has not been hungry on the plan- feels like she may actually be eating more since she is eating the correct food. She has silver sneakers on her phone- she is doing strength exercises. She has been hitting her protein goal of 80 grams a day. Her snacks have been celery with peanut butter, yasso bars, 1/2  graham cracker, grapes and blueberries She is drinking about 64 ounces of water a day and 1-2 diet cokes. She has still been able to go out with her husband and makes smart choices.  Will occasionally have a small bite of husbands dessert. She's going out of town to [place] to visit her daughter for 5 days. She does do a lot of walking while she is there.   PHYSICAL EXAM:  Blood pressure 124/85, pulse 65, temperature 98.3 F (36.8 C), height 5' 2.5 (1.588 m), weight 161 lb (73 kg), SpO2 97%. Body mass index is 28.98 kg/m.  General: Well Developed, well nourished, and in no acute distress.  HEENT: Normocephalic, atraumatic; EOMI, sclerae are anicteric. Skin: Warm and dry, good turgor Chest:  Normal excursion, shape, no gross ABN Respiratory: No conversational dyspnea; speaking in full sentences NeuroM-Sk:  Normal gross ROM * 4 extremities  Psych: A and O X 3, insight adequate, mood- full    DIAGNOSTIC DATA REVIEWED:  BMET    Component Value Date/Time   NA 140 02/24/2024 0930   K 4.1 02/24/2024 0930   CL 100 02/24/2024 0930   CO2 22 02/24/2024 0930   GLUCOSE 77 02/24/2024 0930   GLUCOSE 101 (H) 06/13/2015 0532   BUN 12 02/24/2024 0930   CREATININE 0.87 02/24/2024 0930   CALCIUM  9.8 02/24/2024 0930   GFRNONAA >60 06/13/2015 0532   GFRAA >60 06/13/2015 0532   Lab Results  Component Value Date  HGBA1C 5.5 02/24/2024   Lab Results  Component Value Date   INSULIN  12.8 02/24/2024   Lab Results  Component Value Date   TSH 1.190 02/24/2024   CBC    Component Value Date/Time   WBC 8.9 02/24/2024 0930   WBC 9.3 06/13/2015 0532   RBC 4.68 02/24/2024 0930   RBC 3.43 (L) 06/13/2015 0532   HGB 13.9 02/24/2024 0930   HCT 43.8 02/24/2024 0930   PLT 209 02/24/2024 0930   MCV 94 02/24/2024 0930   MCH 29.7 02/24/2024 0930   MCH 29.4 06/13/2015 0532   MCHC 31.7 02/24/2024 0930   MCHC 32.1 06/13/2015 0532   RDW 12.5 02/24/2024 0930   Iron Studies No results found for:  IRON, TIBC, FERRITIN, IRONPCTSAT Lipid Panel     Component Value Date/Time   CHOL 188 01/22/2024 0859   TRIG 109 01/22/2024 0859   HDL 50 01/22/2024 0859   CHOLHDL 3.8 01/22/2024 0859   LDLCALC 118 (H) 01/22/2024 0859   Hepatic Function Panel     Component Value Date/Time   PROT 7.0 01/22/2024 0859   ALBUMIN 4.3 01/22/2024 0859   AST 24 01/22/2024 0859   ALT 22 01/22/2024 0859   ALKPHOS 74 01/22/2024 0859   BILITOT 0.4 01/22/2024 0859      Component Value Date/Time   TSH 1.190 02/24/2024 0930   Nutritional Lab Results  Component Value Date   VD25OH 28.7 (L) 02/24/2024     ASSESSMENT AND PLAN  Class 1 obesity with serious comorbidity and body mass index (BMI) of 31.0 to 31.9 in adult, unspecified obesity type TREATMENT PLAN FOR OBESITY:  Recommended Dietary Goals  Rachel Velez is currently in the action stage of change. As such, her goal is to continue weight management plan. She has agreed to the Category 1 Plan.  Behavioral Intervention  We discussed the following Behavioral Modification Strategies today: continue to work on maintaining a reduced calorie state, getting the recommended amount of protein, incorporating whole foods, making healthy choices, staying well hydrated and practicing mindfulness when eating. and increase protein intake, fibrous foods (25 grams per day for women, 30 grams for men) and water to improve satiety and decrease hunger signals. .  Additional resources provided today: NA  Recommended Physical Activity Goals  Rachel Velez has been advised to work up to 150 minutes of moderate intensity aerobic activity a week and strengthening exercises 2-3 times per week for cardiovascular health, weight loss maintenance and preservation of muscle mass.   She has agreed to Think about enjoyable ways to increase daily physical activity and overcoming barriers to exercise, Increase physical activity in their day and reduce sedentary time (increase NEAT)., and  Start strengthening exercises with a goal of 2-3 sessions a week . Instructed on adding a weight vest when she walks and using light arm weights to start strength training.    Pharmacotherapy We discussed various medication options to help Rachel Velez with her weight loss efforts and we both agreed to continue Metformin  500 mg every day for insulin  resistance and off label for weight loss.  ASSOCIATED CONDITIONS ADDRESSED TODAY  Action/Plan  Pure hypercholesterolemia Continue category 1 meal plan, limit saturated fats Continue Zetia  10 mg every day  Loss of 10-15% body weight can improve lipid levels Focus on getting 150 minutes a week of moderate to high intensity exercise , add strength training with weight vest and hand weights  Insulin  resistance HOMA-IR score 2.43 Continue Category 1 meal plan, limit simple carbohydrates Continue Metformin  500  mg 1 tab daily with largest meal Decreasing body weight by 10-15% can improve glucose levels Continue exercise with current goal of 150 minutes of moderate to high intensity exercise/week, add strength training with weight vest and hand weights.  -     metFORMIN  HCl ER; Take 1 tablet (500 mg total) by mouth daily with breakfast.  Dispense: 30 tablet; Refill: 1  Vitamin D  deficiency Continue ergocalciferol  once a week and will plan to recheck in November -     Vitamin D  (Ergocalciferol ); Take 1 capsule (50,000 Units total) by mouth every 7 (seven) days.  Dispense: 5 capsule; Refill: 1  Pain of left heel       Keep upcoming appointment with Dr. Gershon, podiatrist          Return in about 4 weeks (around 05/10/2024).SABRA She was informed of the importance of frequent follow up visits to maximize her success with intensive lifestyle modifications for her multiple health conditions.   ATTESTASTION STATEMENTS:  Reviewed by clinician on day of visit: allergies, medications, problem list, medical history, surgical history, family history, social  history, and previous encounter notes.     Rachel Velez ANP-C

## 2024-04-18 ENCOUNTER — Ambulatory Visit: Admitting: Podiatry

## 2024-04-18 ENCOUNTER — Ambulatory Visit

## 2024-04-18 ENCOUNTER — Ambulatory Visit (INDEPENDENT_AMBULATORY_CARE_PROVIDER_SITE_OTHER)

## 2024-04-18 DIAGNOSIS — M7671 Peroneal tendinitis, right leg: Secondary | ICD-10-CM

## 2024-04-18 DIAGNOSIS — M7752 Other enthesopathy of left foot: Secondary | ICD-10-CM

## 2024-04-18 DIAGNOSIS — M7751 Other enthesopathy of right foot: Secondary | ICD-10-CM | POA: Diagnosis not present

## 2024-04-18 DIAGNOSIS — M722 Plantar fascial fibromatosis: Secondary | ICD-10-CM

## 2024-04-18 DIAGNOSIS — M778 Other enthesopathies, not elsewhere classified: Secondary | ICD-10-CM | POA: Diagnosis not present

## 2024-04-18 NOTE — Progress Notes (Signed)
 Gave patient a pair of sample orthotics that fit real well for her to try she will see how they work and will call if there are any issues  I explained break in, wear and care and to quit wearing if they cause pain   [name] CPed, CFo, CFm  --    Subjective:  Patient ID: Rachel Velez, female    DOB: 1955/08/12,  MRN: [id number]  Chief Complaint  Patient presents with   Foot Pain    Left foot pain in the heel pt stated that she is also having pain in her right ankle     Discussed the use of AI scribe software for clinical note transcription with the patient, who gave verbal consent to proceed.  History of Present Illness Rachel Velez is a 68 year old female with osteoarthritis who presents with left heel and right ankle pain.  Right ankle pain began five to six weeks ago, especially when walking downhill, with occasional shooting pain through the bone that subsides after rest. Increased activity, including walking more frequently, has been part of a wellness effort, resulting in a fifteen-pound weight loss over six to seven weeks. A small pouch of swelling is present on the right side, more noticeable after weight loss. A bamboo compression sleeve is used during walking, which alleviates pain.  She walks two and a half miles with a friend three to four times a week, though less frequently due to weather. Recently, she increased walking activity during a trip to [place] . She wears shoes with thicker soles and a small heel. No recent injuries to the ankle or feet, and no unusual swelling.      Objective:  There were no vitals filed for this visit.  Physical Exam General: AAO x3, NAD  Dermatological: Skin is warm, dry and supple bilateral.  There are no open sores, no preulcerative lesions, no rash or signs of infection present.  Vascular: Dorsalis Pedis artery and Posterior Tibial artery pedal pulses are 2/4 bilateral with immedate capillary fill time. There is no pain with  calf compression, swelling, warmth, erythema.   Neruologic: Grossly intact via light touch bilateral.   Musculoskeletal: Chronic discomfort noticeable on the left dorsal midfoot and Lisfranc joint with prominent spurs present with localized edema with a mild there is no erythema or warmth.  On the lateral aspect the right ankle in the course the peroneal tendon there is some localized edema present there is no significant pain on palpation today.  Ankle, subtalar joint range of motion intact.  Decreased medial arch upon weightbearing  Gait: Unassisted, Nonantalgic.     No images are attached to the encounter.    Results RADIOLOGY X-rays obtained reviewed bilaterally.  There is no evidence of acute fracture.  Significant arthritis present the Lisfranc joint left side.  On the right side there is a ossicle present on medial ankle likely from prior injury.  There is no evidence of acute fracture.   Assessment:   Capsulitis left foot with arthritis; peroneal tendinitis right side    Plan:  Patient was evaluated and treated and all questions answered.  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 X-rays show bone on bone changes in the Lisfranc joint.  Long-term we did discuss surgery briefly if needed but we are not at that point and we mutually agreed on this.  Steroid injections considered if pain  becomes unmanageable. - Continue Aleve as needed for pain management. - Apply Voltaren gel to the top of the left foot. - Consider custom orthotic inserts to support foot structure and reduce pressure. - Discuss potential for steroid injections if pain becomes unmanageable. - Educated about surgical options for future consideration if condition worsens.  Right ankle peroneal tendinopathy Recent peroneal tendinopathy in the right ankle, likely due to soft tissue inflammation.  X-rays show no acute fractures. Bamboo compression sleeve provides relief. Prednisone considered if symptoms worsen. - Continue using bamboo compression sleeve during the day. - Apply Voltaren gel to the affected area. - Ice the ankle for 20 minutes a few times a day, especially after walking. - Provide exercises to strengthen the ankle. - Consider short course of prednisone if symptoms worsen. - Consult with Sueanne for custom orthotic inserts.    Return if symptoms worsen or fail to improve.   Donnice JONELLE Fees DPM

## 2024-04-19 ENCOUNTER — Encounter: Payer: Self-pay | Admitting: Podiatry

## 2024-04-19 NOTE — Patient Instructions (Signed)

## 2024-04-27 LAB — LIPID PANEL
Chol/HDL Ratio: 3 ratio (ref 0.0–4.4)
Cholesterol, Total: 158 mg/dL (ref 100–199)
HDL: 53 mg/dL (ref >39)
LDL Chol Calc (NIH): 91 mg/dL (ref 0–99)
Triglycerides: 75 mg/dL (ref 0–149)
VLDL Cholesterol Cal: 14 mg/dL (ref 5–40)

## 2024-04-27 LAB — HEPATIC FUNCTION PANEL
ALT: 19 IU/L (ref 0–32)
AST: 19 IU/L (ref 0–40)
Albumin: 4.5 g/dL (ref 3.9–4.9)
Alkaline Phosphatase: 62 IU/L (ref 49–135)
Bilirubin Total: 0.4 mg/dL (ref 0.0–1.2)
Bilirubin, Direct: 0.17 mg/dL (ref 0.00–0.40)
Total Protein: 7 g/dL (ref 6.0–8.5)

## 2024-05-10 ENCOUNTER — Telehealth (HOSPITAL_BASED_OUTPATIENT_CLINIC_OR_DEPARTMENT_OTHER): Payer: Self-pay | Admitting: *Deleted

## 2024-05-10 ENCOUNTER — Telehealth: Payer: Self-pay | Admitting: Pharmacy Technician

## 2024-05-10 ENCOUNTER — Other Ambulatory Visit (HOSPITAL_COMMUNITY): Payer: Self-pay

## 2024-05-10 MED ORDER — NEXLIZET 180-10 MG PO TABS: 1.0000 | ORAL_TABLET | Freq: Every day | ORAL | 3 refills | Status: AC

## 2024-05-10 NOTE — Telephone Encounter (Signed)
   Pharmacy Patient Advocate Encounter   Received notification from Onbase that prior authorization for nexlizet is required/requested.   Insurance verification completed.   The patient is insured through [employer].   Per pa:

## 2024-05-10 NOTE — Telephone Encounter (Signed)
 Patient Advocate Encounter   The patient was approved for a [employer] grant that will help cover the cost of nexlizet Total amount awarded, 2500.  Effective: 04/10/24 - 04/09/25   APW:[id number] ERW:[id number] Hmnle:[id number] PI:[id number] [employer] ID: [id number]   Pharmacy provided with approval and processing information. Patient informed via mychart

## 2024-05-10 NOTE — Telephone Encounter (Signed)
 Pharmacy Patient Advocate Encounter  Received notification from [employer] that Prior Authorization for NEXLIZET has been APPROVED from 07/15/23 to 07/13/25. Ran test claim, Copay is $297. This test claim was processed through [facility]- copay amounts may vary at other pharmacies due to pharmacy/plan contracts, or as the patient moves through the different stages of their insurance plan.

## 2024-05-10 NOTE — Telephone Encounter (Signed)
 Will route back to PA team to see if patient would qualify for [facility]

## 2024-05-10 NOTE — Telephone Encounter (Signed)
 Glenice Krabbe, CPhT to Me     05/10/24  3:36 PM Lorrene approved and given to pharmacy and patient

## 2024-05-10 NOTE — Addendum Note (Signed)
 Addended by: FREDIRICK BEAU B on: 05/10/2024 07:58 AM   Modules accepted: Orders

## 2024-05-10 NOTE — Telephone Encounter (Signed)
 Patient started on Nexlizet, see lab note 10/14  (Nexlizet will likely require prior auth and possible [employer] grant) Will forward to PA team

## 2024-05-12 ENCOUNTER — Ambulatory Visit (INDEPENDENT_AMBULATORY_CARE_PROVIDER_SITE_OTHER): Admitting: Nurse Practitioner

## 2024-05-12 ENCOUNTER — Encounter (INDEPENDENT_AMBULATORY_CARE_PROVIDER_SITE_OTHER): Payer: Self-pay | Admitting: Nurse Practitioner

## 2024-05-12 VITALS — BP 115/79 | HR 70 | Temp 98.5°F | Ht 62.5 in | Wt 155.0 lb

## 2024-05-12 DIAGNOSIS — E559 Vitamin D deficiency, unspecified: Secondary | ICD-10-CM

## 2024-05-12 DIAGNOSIS — E88819 Insulin resistance, unspecified: Secondary | ICD-10-CM

## 2024-05-12 DIAGNOSIS — Z6828 Body mass index (BMI) 28.0-28.9, adult: Secondary | ICD-10-CM

## 2024-05-12 DIAGNOSIS — E7800 Pure hypercholesterolemia, unspecified: Secondary | ICD-10-CM | POA: Diagnosis not present

## 2024-05-12 DIAGNOSIS — E663 Overweight: Secondary | ICD-10-CM | POA: Diagnosis not present

## 2024-05-12 NOTE — Progress Notes (Signed)
 Office: [phone]  /  Fax: [phone]  WEIGHT SUMMARY AND BIOMETRICS  Weight Lost Since Last Visit: 6 lb  Weight Gained Since Last Visit: 0   Vitals Temp: 98.5 F (36.9 C) BP: 115/79 Pulse Rate: 70 SpO2: 96 %   Anthropometric Measurements Height: 5' 2.5 (1.588 m) Weight: 155 lb (70.3 kg) BMI (Calculated): 27.88 Weight at Last Visit: 161 lb Weight Lost Since Last Visit: 6 lb Weight Gained Since Last Visit: 0 Starting Weight: 174 lb Total Weight Loss (lbs): 19 lb (8.618 kg) Peak Weight: 188 lb   Body Composition  Body Fat %: 37.8 % Fat Mass (lbs): 58.8 lbs Muscle Mass (lbs): 91.6 lbs Total Body Water (lbs): 64.8 lbs Visceral Fat Rating : 10   Other Clinical Data Fasting: no Labs: no Today's Visit #: 5 Starting Date: 02/24/24    Total Weight Loss:19 pounds PBW lost: 11% Bio Impedance Data reviewed with patient: Muscle down 2.4 pounds, adipose down 3.2 pounds. PBF decreased from 38.5 to 37.8%, visceral fat rating decreased from 11 to 10  HPI  Chief Complaint: OBESITY  Rachel Velez is here to discuss her progress with her obesity treatment plan. Rachel Velez is on the the Category 1 Plan and states Rachel Velez is following her eating plan approximately 75 % of the time. Rachel Velez states Rachel Velez is exercising 50 minutes 2-4 days per week.   Interval History:  Since last office visit Rachel Velez Went to [employer] Rachel Velez has been away several times.  Rachel Velez has eaten out more but has picked lean protein and vegetables. Rachel Velez has been limiting her carbs. Rachel Velez has not skipped meals. Continues to drink 80 ounces of water daily. Rachel Velez will have 1-2 diet cokes a day. 2 drinks of red wine 2 days a week. Rachel Velez has been walking 50 minutes 2 days a week. Rachel Velez does a lot of volunteering.  Rachel Velez has been switched from Zetia  to Nexlizet., has not yet started. Rachel Velez continues to use Metformin  500 mg 1 tab daily. Denies side effects Continue Ergocalciferol  50000 units once a week and denies side effects  PHYSICAL EXAM:  Blood  pressure 115/79, pulse 70, temperature 98.5 F (36.9 C), height 5' 2.5 (1.588 m), weight 155 lb (70.3 kg), SpO2 96%. Body mass index is 27.9 kg/m.  General: Well Developed, well nourished, and in no acute distress.  HEENT: Normocephalic, atraumatic; EOMI, sclerae are anicteric. Skin: Warm and dry, good turgor Chest:  Normal excursion, shape, no gross ABN Respiratory: No conversational dyspnea; speaking in full sentences NeuroM-Sk:  Normal gross ROM * 4 extremities  Psych: A and O X 3, insight adequate, mood- full    DIAGNOSTIC DATA REVIEWED:  Last metabolic panel Lab Results  Component Value Date   GLUCOSE 77 02/24/2024   NA 140 02/24/2024   K 4.1 02/24/2024   CL 100 02/24/2024   CO2 22 02/24/2024   BUN 12 02/24/2024   CREATININE 0.87 02/24/2024   EGFR 73 02/24/2024   CALCIUM  9.8 02/24/2024   PROT 7.0 04/26/2024   ALBUMIN 4.5 04/26/2024   LABGLOB 2.7 01/22/2024   BILITOT 0.4 04/26/2024   ALKPHOS 62 04/26/2024   AST 19 04/26/2024   ALT 19 04/26/2024   ANIONGAP 6 06/13/2015     Lab Results  Component Value Date   HGBA1C 5.5 02/24/2024   Lab Results  Component Value Date   INSULIN  12.8 02/24/2024   Lab Results  Component Value Date   TSH 1.190 02/24/2024   CBC    Component Value Date/Time   WBC  8.9 02/24/2024 0930   WBC 9.3 06/13/2015 0532   RBC 4.68 02/24/2024 0930   RBC 3.43 (L) 06/13/2015 0532   HGB 13.9 02/24/2024 0930   HCT 43.8 02/24/2024 0930   PLT 209 02/24/2024 0930   MCV 94 02/24/2024 0930   MCH 29.7 02/24/2024 0930   MCH 29.4 06/13/2015 0532   MCHC 31.7 02/24/2024 0930   MCHC 32.1 06/13/2015 0532   RDW 12.5 02/24/2024 0930   Iron Studies No results found for: IRON, TIBC, FERRITIN, IRONPCTSAT Lipid Panel     Component Value Date/Time   CHOL 158 04/26/2024 0942   TRIG 75 04/26/2024 0942   HDL 53 04/26/2024 0942   CHOLHDL 3.0 04/26/2024 0942   LDLCALC 91 04/26/2024 0942    Nutritional Lab Results  Component Value Date    VD25OH 28.7 (L) 02/24/2024     ASSESSMENT AND PLAN  Overweight with body mass index (BMI) of 28 to 28.9 in adult TREATMENT PLAN FOR OBESITY:  Recommended Dietary Goals  Rachel Velez is currently in the action stage of change. As such, her goal is to continue weight management plan. Rachel Velez has agreed to the Category 1 Plan.  Behavioral Intervention  We discussed the following Behavioral Modification Strategies today: continue to work on maintaining a reduced calorie state, getting the recommended amount of protein, incorporating whole foods, making healthy choices, staying well hydrated and practicing mindfulness when eating. and increase protein intake, fibrous foods (25 grams per day for women, 30 grams for men) and water to improve satiety and decrease hunger signals. .   Recommended Physical Activity Goals  Rachel Velez has been advised to work up to 150 minutes of moderate intensity aerobic activity a week and strengthening exercises 2-3 times per week for cardiovascular health, weight loss maintenance and preservation of muscle mass.   Rachel Velez has agreed to Think about enjoyable ways to increase daily physical activity and overcoming barriers to exercise, Increase physical activity in their day and reduce sedentary time (increase NEAT)., Increase volume of physical activity to a goal of 240 minutes a week, and Combine aerobic and strengthening exercises for efficiency and improved cardiometabolic health.   Pharmacotherapy We discussed various medication options to help Rachel Velez with her weight loss efforts and we both agreed to continue Metformin .XR 500 mg 1 tab daily for insulin  resistance  ASSOCIATED CONDITIONS ADDRESSED TODAY  Action/Plan  Pure hypercholesterolemia       Continue to follow with cardiology/lipid clinic       Rachel Velez is being switched from Zetia  to Nexlizet       Continue to limit saturated fats and follow category 1 meal plan for weight loss  Insulin  resistance HOMA-IR score  2.43 Continue Category 1  meal plan, limit simple carbohydrates Decreasing body weight by 10-15% can improve glucose levels Continue exercise with current goal of 240 minutes of moderate to high intensity exercise/week with strength training 2-3 days a week Continue Metformin  XR 500 mg 1 tab PO every day  Vitamin D  deficiency       Continue to  supplement with Ergocalciferol  50000 units once a week, denies side effects- recheck VIT D level next visit      Return in about 5 weeks (around 06/16/2024).SABRA Rachel Velez was informed of the importance of frequent follow up visits to maximize her success with intensive lifestyle modifications for her multiple health conditions.   ATTESTASTION STATEMENTS:  Reviewed by clinician on day of visit: allergies, medications, problem list, medical history, surgical history, family history, social history, and  previous encounter notes.     Corlette Ciano ANP-C

## 2024-05-16 DIAGNOSIS — Z1231 Encounter for screening mammogram for malignant neoplasm of breast: Secondary | ICD-10-CM | POA: Diagnosis not present

## 2024-05-23 DIAGNOSIS — Z6828 Body mass index (BMI) 28.0-28.9, adult: Secondary | ICD-10-CM | POA: Diagnosis not present

## 2024-05-23 DIAGNOSIS — Z124 Encounter for screening for malignant neoplasm of cervix: Secondary | ICD-10-CM | POA: Diagnosis not present

## 2024-05-23 DIAGNOSIS — Z1272 Encounter for screening for malignant neoplasm of vagina: Secondary | ICD-10-CM | POA: Diagnosis not present

## 2024-06-11 ENCOUNTER — Other Ambulatory Visit (INDEPENDENT_AMBULATORY_CARE_PROVIDER_SITE_OTHER): Payer: Self-pay | Admitting: Nurse Practitioner

## 2024-06-11 DIAGNOSIS — E88819 Insulin resistance, unspecified: Secondary | ICD-10-CM

## 2024-06-13 ENCOUNTER — Encounter (INDEPENDENT_AMBULATORY_CARE_PROVIDER_SITE_OTHER): Payer: Self-pay | Admitting: Nurse Practitioner

## 2024-06-13 ENCOUNTER — Ambulatory Visit (INDEPENDENT_AMBULATORY_CARE_PROVIDER_SITE_OTHER): Admitting: Nurse Practitioner

## 2024-06-13 VITALS — BP 129/74 | HR 67 | Temp 98.2°F | Ht 62.5 in | Wt 151.0 lb

## 2024-06-13 DIAGNOSIS — E88819 Insulin resistance, unspecified: Secondary | ICD-10-CM | POA: Diagnosis not present

## 2024-06-13 DIAGNOSIS — Z6831 Body mass index (BMI) 31.0-31.9, adult: Secondary | ICD-10-CM

## 2024-06-13 DIAGNOSIS — E559 Vitamin D deficiency, unspecified: Secondary | ICD-10-CM | POA: Diagnosis not present

## 2024-06-13 DIAGNOSIS — E66811 Obesity, class 1: Secondary | ICD-10-CM

## 2024-06-13 DIAGNOSIS — E7800 Pure hypercholesterolemia, unspecified: Secondary | ICD-10-CM | POA: Diagnosis not present

## 2024-06-13 MED ORDER — METFORMIN HCL ER 500 MG PO TB24: 500.0000 mg | ORAL_TABLET | Freq: Every day | ORAL | 1 refills | Status: DC

## 2024-06-13 NOTE — Progress Notes (Signed)
 Office: [phone]  /  Fax: [phone]  WEIGHT SUMMARY AND BIOMETRICS  Weight Lost Since Last Visit: 4 lb  Weight Gained Since Last Visit: 0   Vitals Temp: 98.2 F (36.8 C) BP: 129/74 Pulse Rate: 67 SpO2: 100 %   Anthropometric Measurements Height: 5' 2.5 (1.588 m) Weight: 151 lb (68.5 kg) BMI (Calculated): 27.16 Weight at Last Visit: 155 LB Weight Lost Since Last Visit: 4 lb Weight Gained Since Last Visit: 0 Starting Weight: 174 LB Total Weight Loss (lbs): 23 lb (10.4 kg) Peak Weight: 188 LB   Body Composition  Body Fat %: 36.7 % Fat Mass (lbs): 55.6 lbs Muscle Mass (lbs): 91 lbs Total Body Water (lbs): 63.6 lbs Visceral Fat Rating : 9   Other Clinical Data Fasting: NO Labs: NO Today's Visit #: 6 Starting Date: 02/24/24    Total Weight Loss: 23 pounds Percent of body weight lost: 13.2% Bio Impedance Data reviewed with patient: muscle mass is down 0.6 pounds. Adipose is down 3.2 pounds. PBF decreased from 37.8% to 36.7%. Visceral fat rating decreased 1 point from 10 to 9.   HPI  Chief Complaint: OBESITY  Lavora is here to discuss her progress with her obesity treatment plan. She is on the the Category 1 Plan and states she is following her eating plan approximately 60 % of the time. She states she is not currently exercising   Interval History:  Since last office visit she has not been exercising as much. She used to walk in the evenings after dinner with her friend but has not since it has been dark so early.  Has done chair yoga in the past.  She has been doing arm weights.  She did well with Thanksgiving- she ate turkey, green beans and a little potatoes and black eyed peas.  She went back on estradiol patch BIW, previously used and found that she slept better and had better mood and less hot flashes.  Her goal weight is 145.   She is currently taking Nexlizet  180/10 mg every day for hypercholesterolemia- denies side effects Lab Results   Component Value Date   CHOL 158 04/26/2024   HDL 53 04/26/2024   LDLCALC 91 04/26/2024   TRIG 75 04/26/2024   CHOLHDL 3.0 04/26/2024    She is on Metformin  XR 500 mg 1 tab PO every day and denies side effects She is on Ergocalciferol  50000 units once a week for Vit D deficiency.  Will recheck lab in January.  PHYSICAL EXAM:  Blood pressure 129/74, pulse 67, temperature 98.2 F (36.8 C), height 5' 2.5 (1.588 m), weight 151 lb (68.5 kg), SpO2 100%. Body mass index is 27.18 kg/m.  General: Well Developed, well nourished, and in no acute distress.  HEENT: Normocephalic, atraumatic; EOMI, sclerae are anicteric. Skin: Warm and dry, good turgor Chest:  Normal excursion, shape, no gross ABN Respiratory: No conversational dyspnea; speaking in full sentences NeuroM-Sk:  Normal gross ROM * 4 extremities  Psych: A and O X 3, insight adequate, mood- full    DIAGNOSTIC DATA REVIEWED:  BMET    Component Value Date/Time   NA 140 02/24/2024 0930   K 4.1 02/24/2024 0930   CL 100 02/24/2024 0930   CO2 22 02/24/2024 0930   GLUCOSE 77 02/24/2024 0930   GLUCOSE 101 (H) 06/13/2015 0532   BUN 12 02/24/2024 0930   CREATININE 0.87 02/24/2024 0930   CALCIUM  9.8 02/24/2024 0930   GFRNONAA >60 06/13/2015 0532   GFRAA >60 06/13/2015 0532  Lab Results  Component Value Date   HGBA1C 5.5 02/24/2024   Lab Results  Component Value Date   INSULIN  12.8 02/24/2024   Lab Results  Component Value Date   TSH 1.190 02/24/2024   CBC    Component Value Date/Time   WBC 8.9 02/24/2024 0930   WBC 9.3 06/13/2015 0532   RBC 4.68 02/24/2024 0930   RBC 3.43 (L) 06/13/2015 0532   HGB 13.9 02/24/2024 0930   HCT 43.8 02/24/2024 0930   PLT 209 02/24/2024 0930   MCV 94 02/24/2024 0930   MCH 29.7 02/24/2024 0930   MCH 29.4 06/13/2015 0532   MCHC 31.7 02/24/2024 0930   MCHC 32.1 06/13/2015 0532   RDW 12.5 02/24/2024 0930   Iron Studies No results found for: IRON, TIBC, FERRITIN,  IRONPCTSAT Lipid Panel     Component Value Date/Time   CHOL 158 04/26/2024 0942   TRIG 75 04/26/2024 0942   HDL 53 04/26/2024 0942   CHOLHDL 3.0 04/26/2024 0942   LDLCALC 91 04/26/2024 0942   Hepatic Function Panel     Component Value Date/Time   PROT 7.0 04/26/2024 0942   ALBUMIN 4.5 04/26/2024 0942   AST 19 04/26/2024 0942   ALT 19 04/26/2024 0942   ALKPHOS 62 04/26/2024 0942   BILITOT 0.4 04/26/2024 0942   BILIDIR 0.17 04/26/2024 0942      Component Value Date/Time   TSH 1.190 02/24/2024 0930   Nutritional Lab Results  Component Value Date   VD25OH 28.7 (L) 02/24/2024     ASSESSMENT AND PLAN Class 1 obesity with serious comorbidity and body mass index (BMI) of 31.0 to 31.9 in adult, unspecified obesity type TREATMENT PLAN FOR OBESITY:  Recommended Dietary Goals  Edda is currently in the action stage of change. As such, her goal is to continue weight management plan. She has agreed to the Category 1 Plan.  Behavioral Intervention  We discussed the following Behavioral Modification Strategies today: celebration eating strategies- one plate with protein as largest portion, clean vegetable and then portion of anything she wants to try but food cannot touch on the plate, be mindful and enjoy your food  , continue to work on maintaining a reduced calorie state, getting the recommended amount of protein, incorporating whole foods, making healthy choices, staying well hydrated and practicing mindfulness when eating., and increase protein intake, fibrous foods (25 grams per day for women, 30 grams for men) and water to improve satiety and decrease hunger signals. .   Recommended Physical Activity Goals  Letetia has been advised to work up to 150 minutes of moderate intensity aerobic activity a week and strengthening exercises 2-3 times per week for cardiovascular health, weight loss maintenance and preservation of muscle mass.   She has agreed to Start aerobic activity  with a goal of 150 minutes a week at moderate intensity.  and Combine aerobic and strengthening exercises for efficiency and improved cardiometabolic health.   Pharmacotherapy We discussed various medication options to help Latima with her weight loss efforts and we both agreed to continue Metformin  XR 500 mg 1 cap daily for insulin  resistance- denies side effects.  Will continue Ergocalciferol  50000 units once a week for Vit D deficiency- denies side effects.  ASSOCIATED CONDITIONS ADDRESSED TODAY  Action/Plan  Vitamin D  deficiency       Continue to supplement with Ergocalciferol  50000 units once a week . Wants to wait until January to have her lab rechecked, check next visit.  Insulin  resistance HOMA-IR score 2.43  Continue Category 1  meal plan, limit simple carbohydrates, increased lean proteins Continue exercise with current goal of 150 minutes of moderate to high intensity exercise/week.  -     metFORMIN  HCl ER; Take 1 tablet (500 mg total) by mouth daily with breakfast.  Dispense: 30 tablet; Refill: 1  Pure hypercholesterolemia Continue category 1 meal plan, limit saturated fats Continue Nexlizet  180/10 mg every day and follow with cardiology regularly Focus on getting 150 minutes a week of moderate to high intensity exercise , given website for [facility] for chair exercises       Return in about 5 weeks (around 07/18/2024).SABRA She was informed of the importance of frequent follow up visits to maximize her success with intensive lifestyle modifications for her multiple health conditions.   ATTESTASTION STATEMENTS:  Reviewed by clinician on day of visit: allergies, medications, problem list, medical history, surgical history, family history, social history, and previous encounter notes.     Wilkins Elpers ANP-C

## 2024-07-14 ENCOUNTER — Other Ambulatory Visit (INDEPENDENT_AMBULATORY_CARE_PROVIDER_SITE_OTHER): Payer: Self-pay | Admitting: Nurse Practitioner

## 2024-07-14 DIAGNOSIS — E559 Vitamin D deficiency, unspecified: Secondary | ICD-10-CM

## 2024-07-18 ENCOUNTER — Other Ambulatory Visit (HOSPITAL_BASED_OUTPATIENT_CLINIC_OR_DEPARTMENT_OTHER): Payer: Self-pay

## 2024-07-18 MED ORDER — ESTRADIOL 0.05 MG/24HR TD PTTW
1.0000 | MEDICATED_PATCH | TRANSDERMAL | 7 refills | Status: AC
  Filled 2024-07-18: qty 8, 28d supply, fill #0
  Filled 2024-08-08: qty 8, 28d supply, fill #1

## 2024-07-19 ENCOUNTER — Encounter (INDEPENDENT_AMBULATORY_CARE_PROVIDER_SITE_OTHER): Payer: Self-pay | Admitting: Nurse Practitioner

## 2024-07-19 ENCOUNTER — Ambulatory Visit (INDEPENDENT_AMBULATORY_CARE_PROVIDER_SITE_OTHER): Admitting: Nurse Practitioner

## 2024-07-19 VITALS — BP 132/80 | HR 58 | Temp 98.5°F | Ht 62.5 in | Wt 150.0 lb

## 2024-07-19 DIAGNOSIS — E559 Vitamin D deficiency, unspecified: Secondary | ICD-10-CM | POA: Diagnosis not present

## 2024-07-19 DIAGNOSIS — E7800 Pure hypercholesterolemia, unspecified: Secondary | ICD-10-CM

## 2024-07-19 DIAGNOSIS — E663 Overweight: Secondary | ICD-10-CM

## 2024-07-19 DIAGNOSIS — Z6827 Body mass index (BMI) 27.0-27.9, adult: Secondary | ICD-10-CM

## 2024-07-19 DIAGNOSIS — E88819 Insulin resistance, unspecified: Secondary | ICD-10-CM

## 2024-07-19 MED ORDER — VITAMIN D (ERGOCALCIFEROL) 1.25 MG (50000 UNIT) PO CAPS: 50000.0000 [IU] | ORAL_CAPSULE | ORAL | 1 refills | Status: AC

## 2024-07-19 MED ORDER — METFORMIN HCL ER 500 MG PO TB24: 500.0000 mg | ORAL_TABLET | Freq: Every day | ORAL | 1 refills | Status: AC

## 2024-07-19 NOTE — Progress Notes (Signed)
 " Office: [phone]  /  Fax: [phone]  WEIGHT SUMMARY AND BIOMETRICS  Weight Lost Since Last Visit: 1 lb  Weight Gained Since Last Visit: 0   Vitals Temp: 98.5 F (36.9 C) BP: 132/80 Pulse Rate: (!) 58 SpO2: 98 %   Anthropometric Measurements Height: 5' 2.5 (1.588 m) Weight: 150 lb (68 kg) BMI (Calculated): 26.98 Weight at Last Visit: 151 lb Weight Lost Since Last Visit: 1 lb Weight Gained Since Last Visit: 0 Starting Weight: 174 lb Total Weight Loss (lbs): 24 lb (10.9 kg) Peak Weight: 188 lb   Body Composition  Body Fat %: 37.8 % Fat Mass (lbs): 56.8 lbs Muscle Mass (lbs): 88.6 lbs Total Body Water (lbs): 64.6 lbs Visceral Fat Rating : 10   Other Clinical Data Fasting: No Labs: No Today's Visit #: 7 Starting Date: 02/24/24    Total Weight Loss: 24 pounds Percent of body weight lost: 13.8%   Bio Impedance Data reviewed with patient: Muscle is down 2.4 pounds and adipose is up 1.2 pounds.   HPI  Chief Complaint: OBESITY  Rachel Velez is here to discuss her progress with her obesity treatment plan. She is on the following a lower carbohydrate, vegetable and lean protein rich diet plan and states she is following her eating plan approximately 50 % of the time. She states she is not exercising- her mother is in the hospital   Interval History:  Since last office visit she has been trying to get her protein in but had an episode of potential tooth abscess . She was on a soft diet. Symptoms are improving but is still on soft diet. She has been not able to get as many fruits/vegetables. She is sleeping with a mouth guard because they think she may be grinding her teeth. . Her mom is in rehab s/p cardioversion for atrial fibrillation.   She has been trying to get more protein but is limited  since on the soft diet.  She has been drinking at least 64 ounces 3/5 days .   She is back on Vivelle  dot 0.05mg  BIW through her GYN  Rachel Velez continues on Ergocalciferol   50000 units once a week and denies side effects Last vitamin D  Lab Results  Component Value Date   VD25OH 28.7 (L) 02/24/2024    She continues on Metformin  XR 500 mg every day for insulin  resistance and denies side effects.   She continues to follow with the lipid clinic for hyperlipidemia and is currently on Nexlizet  180/10 mg every day without side effects. LDL goal is < 70.  Lab Results  Component Value Date   CHOL 158 04/26/2024   HDL 53 04/26/2024   LDLCALC 91 04/26/2024   TRIG 75 04/26/2024   CHOLHDL 3.0 04/26/2024     PHYSICAL EXAM:  Blood pressure 132/80, pulse (!) 58, temperature 98.5 F (36.9 C), height 5' 2.5 (1.588 m), weight 150 lb (68 kg), SpO2 98%. Body mass index is 27 kg/m.  General: Well Developed, well nourished, and in no acute distress.  HEENT: Normocephalic, atraumatic; EOMI, sclerae are anicteric. Skin: Warm and dry, good turgor Chest:  Normal excursion, shape, no gross ABN Respiratory: No conversational dyspnea; speaking in full sentences NeuroM-Sk:  Normal gross ROM * 4 extremities  Psych: A and O X 3, insight adequate, mood- full    DIAGNOSTIC DATA REVIEWED:  BMET    Component Value Date/Time   NA 140 02/24/2024 0930   K 4.1 02/24/2024 0930   CL 100 02/24/2024 0930  CO2 22 02/24/2024 0930   GLUCOSE 77 02/24/2024 0930   GLUCOSE 101 (H) 06/13/2015 0532   BUN 12 02/24/2024 0930   CREATININE 0.87 02/24/2024 0930   CALCIUM  9.8 02/24/2024 0930   GFRNONAA >60 06/13/2015 0532   GFRAA >60 06/13/2015 0532   Lab Results  Component Value Date   HGBA1C 5.5 02/24/2024   Lab Results  Component Value Date   INSULIN  12.8 02/24/2024   Lab Results  Component Value Date   TSH 1.190 02/24/2024   CBC    Component Value Date/Time   WBC 8.9 02/24/2024 0930   WBC 9.3 06/13/2015 0532   RBC 4.68 02/24/2024 0930   RBC 3.43 (L) 06/13/2015 0532   HGB 13.9 02/24/2024 0930   HCT 43.8 02/24/2024 0930   PLT 209 02/24/2024 0930   MCV 94 02/24/2024  0930   MCH 29.7 02/24/2024 0930   MCH 29.4 06/13/2015 0532   MCHC 31.7 02/24/2024 0930   MCHC 32.1 06/13/2015 0532   RDW 12.5 02/24/2024 0930   Iron Studies No results found for: IRON, TIBC, FERRITIN, IRONPCTSAT Lipid Panel     Component Value Date/Time   CHOL 158 04/26/2024 0942   TRIG 75 04/26/2024 0942   HDL 53 04/26/2024 0942   CHOLHDL 3.0 04/26/2024 0942   LDLCALC 91 04/26/2024 0942   Hepatic Function Panel     Component Value Date/Time   PROT 7.0 04/26/2024 0942   ALBUMIN 4.5 04/26/2024 0942   AST 19 04/26/2024 0942   ALT 19 04/26/2024 0942   ALKPHOS 62 04/26/2024 0942   BILITOT 0.4 04/26/2024 0942   BILIDIR 0.17 04/26/2024 0942      Component Value Date/Time   TSH 1.190 02/24/2024 0930   Nutritional Lab Results  Component Value Date   VD25OH 28.7 (L) 02/24/2024     ASSESSMENT AND PLAN Overweight with body mass index (BMI) of 27 to 27.9 in adult TREATMENT PLAN FOR OBESITY:  Recommended Dietary Goals  Rachel Velez is currently in the action stage of change. As such, her goal is to continue weight management plan. She has agreed to following a lower carbohydrate, vegetable and lean protein rich diet plan.  Behavioral Intervention  We discussed the following Behavioral Modification Strategies today: increasing lean protein intake to established goals, increasing fiber rich foods, increasing water intake , continue to work on maintaining a reduced calorie state, getting the recommended amount of protein, incorporating whole foods, making healthy choices, staying well hydrated and practicing mindfulness when eating., and increase protein intake, fibrous foods (25 grams per day for women, 30 grams for men) and water to improve satiety and decrease hunger signals. Given ideas for soft protein foods due to tooth pain    Recommended Physical Activity Goals  Rachel Velez has been advised to work up to 150 minutes of moderate intensity aerobic activity a week and  strengthening exercises 2-3 times per week for cardiovascular health, weight loss maintenance and preservation of muscle mass.   She has agreed to Increase physical activity in their day and reduce sedentary time (increase NEAT)., Increase volume of physical activity to a goal of 150 minutes a week, and Combine aerobic and strengthening exercises for efficiency and improved cardiometabolic health.   Pharmacotherapy We discussed various medication options to help Rachel Velez with her weight loss efforts and we both agreed to continue Ergocalciferol  50000 units once a week for Vit D deficiency- denies side effects.  Continue Metformin  500 mg every day for insulin  resistance- denies side effects.  ASSOCIATED CONDITIONS  ADDRESSED TODAY  Action/Plan  Vitamin D  deficiency Supplement with Ergocalciferol  50000 units once a week Low vitamin D  levels can be associated with adiposity and may result in leptin resistance and weight gain. Also associated with fatigue.  Currently on vitamin D  supplementation without any adverse effects such as nausea, vomiting or muscle weakness.   -     VITAMIN D  25 Hydroxy (Vit-D Deficiency, Fractures) -     Vitamin D  (Ergocalciferol ); Take 1 capsule (50,000 Units total) by mouth every 7 (seven) days.  Dispense: 5 capsule; Refill: 1  Insulin  resistance HOMA-IR score 2.43 Continue following a lower carbohydrate, vegetable and lean protein rich diet plan, limit simple carbohydrates Continue Metformin  XR 500 mg every day- denies side effects Continue exercise with current goal of 150 minutes of moderate to high intensity exercise/week.  -     metFORMIN  HCl ER; Take 1 tablet (500 mg total) by mouth daily with breakfast.  Dispense: 30 tablet; Refill: 1  Pure hypercholesterolemia Focus on implementing a lower carbohydrate, vegetable and lean protein rich diet plan, limit saturated fats. Increase lean protein, water and fiber Continue Nexlizet  and follow with the lipid  clinic Focus on getting 150 minutes a week of moderate to high intensity exercise         Return in about 4 weeks (around 08/16/2024).Rachel Velez She was informed of the importance of frequent follow up visits to maximize her success with intensive lifestyle modifications for her multiple health conditions.   ATTESTASTION STATEMENTS:  Reviewed by clinician on day of visit: allergies, medications, problem list, medical history, surgical history, family history, social history, and previous encounter notes.     Lonell Liverpool ANP-C "

## 2024-07-20 LAB — VITAMIN D 25 HYDROXY (VIT D DEFICIENCY, FRACTURES): Vit D, 25-Hydroxy: 66.5 ng/mL (ref 30.0–100.0)

## 2024-07-21 ENCOUNTER — Ambulatory Visit (INDEPENDENT_AMBULATORY_CARE_PROVIDER_SITE_OTHER): Payer: Self-pay | Admitting: Nurse Practitioner

## 2024-08-08 ENCOUNTER — Other Ambulatory Visit (HOSPITAL_BASED_OUTPATIENT_CLINIC_OR_DEPARTMENT_OTHER): Payer: Self-pay

## 2024-08-16 ENCOUNTER — Ambulatory Visit (INDEPENDENT_AMBULATORY_CARE_PROVIDER_SITE_OTHER): Admitting: Nurse Practitioner

## 2024-08-23 ENCOUNTER — Ambulatory Visit (INDEPENDENT_AMBULATORY_CARE_PROVIDER_SITE_OTHER): Admitting: Nurse Practitioner

## 2024-09-07 ENCOUNTER — Ambulatory Visit (INDEPENDENT_AMBULATORY_CARE_PROVIDER_SITE_OTHER): Admitting: Nurse Practitioner
